# Patient Record
Sex: Female | Born: 1935
Health system: Southern US, Community
[De-identification: ages and names within clinical notes are randomized; demographics above are authoritative.]

## PROBLEM LIST (undated history)

## (undated) DIAGNOSIS — C801 Malignant (primary) neoplasm, unspecified: Secondary | ICD-10-CM

## (undated) DIAGNOSIS — I509 Heart failure, unspecified: Secondary | ICD-10-CM

---

## 1973-05-15 HISTORY — PX: ABDOMINAL HYSTERECTOMY: SHX81

## 1997-08-25 ENCOUNTER — Other Ambulatory Visit: Admission: RE | Admit: 1997-08-25 | Discharge: 1997-08-25 | Payer: Self-pay | Admitting: Family Medicine

## 2000-01-27 ENCOUNTER — Encounter: Admission: RE | Admit: 2000-01-27 | Discharge: 2000-01-27 | Payer: Self-pay | Admitting: Family Medicine

## 2000-01-27 ENCOUNTER — Encounter: Payer: Self-pay | Admitting: Family Medicine

## 2000-10-04 ENCOUNTER — Other Ambulatory Visit: Admission: RE | Admit: 2000-10-04 | Discharge: 2000-10-04 | Payer: Self-pay | Admitting: Family Medicine

## 2001-01-28 ENCOUNTER — Encounter: Admission: RE | Admit: 2001-01-28 | Discharge: 2001-01-28 | Payer: Self-pay | Admitting: Family Medicine

## 2001-01-28 ENCOUNTER — Encounter: Payer: Self-pay | Admitting: Family Medicine

## 2002-01-29 ENCOUNTER — Encounter: Payer: Self-pay | Admitting: Family Medicine

## 2002-01-29 ENCOUNTER — Encounter: Admission: RE | Admit: 2002-01-29 | Discharge: 2002-01-29 | Payer: Self-pay | Admitting: Family Medicine

## 2002-02-04 ENCOUNTER — Other Ambulatory Visit: Admission: RE | Admit: 2002-02-04 | Discharge: 2002-02-04 | Payer: Self-pay | Admitting: Family Medicine

## 2002-02-11 ENCOUNTER — Encounter: Admission: RE | Admit: 2002-02-11 | Discharge: 2002-02-11 | Payer: Self-pay | Admitting: Family Medicine

## 2002-02-11 ENCOUNTER — Encounter: Payer: Self-pay | Admitting: Family Medicine

## 2003-02-04 ENCOUNTER — Encounter: Admission: RE | Admit: 2003-02-04 | Discharge: 2003-02-04 | Payer: Self-pay | Admitting: Family Medicine

## 2003-02-04 ENCOUNTER — Encounter: Payer: Self-pay | Admitting: Family Medicine

## 2003-02-11 ENCOUNTER — Other Ambulatory Visit: Admission: RE | Admit: 2003-02-11 | Discharge: 2003-02-11 | Payer: Self-pay | Admitting: Family Medicine

## 2004-02-16 ENCOUNTER — Encounter: Admission: RE | Admit: 2004-02-16 | Discharge: 2004-02-16 | Payer: Self-pay | Admitting: Family Medicine

## 2005-02-23 ENCOUNTER — Encounter: Admission: RE | Admit: 2005-02-23 | Discharge: 2005-02-23 | Payer: Self-pay | Admitting: Family Medicine

## 2006-03-16 ENCOUNTER — Other Ambulatory Visit: Admission: RE | Admit: 2006-03-16 | Discharge: 2006-03-16 | Payer: Self-pay | Admitting: Family Medicine

## 2006-03-29 ENCOUNTER — Encounter: Admission: RE | Admit: 2006-03-29 | Discharge: 2006-03-29 | Payer: Self-pay | Admitting: Family Medicine

## 2007-04-19 ENCOUNTER — Encounter: Admission: RE | Admit: 2007-04-19 | Discharge: 2007-04-19 | Payer: Self-pay | Admitting: Family Medicine

## 2008-04-20 ENCOUNTER — Encounter: Admission: RE | Admit: 2008-04-20 | Discharge: 2008-04-20 | Payer: Self-pay | Admitting: Family Medicine

## 2008-06-02 ENCOUNTER — Other Ambulatory Visit: Admission: RE | Admit: 2008-06-02 | Discharge: 2008-06-02 | Payer: Self-pay | Admitting: Family Medicine

## 2009-04-26 ENCOUNTER — Encounter: Admission: RE | Admit: 2009-04-26 | Discharge: 2009-04-26 | Payer: Self-pay | Admitting: Family Medicine

## 2010-04-27 ENCOUNTER — Encounter
Admission: RE | Admit: 2010-04-27 | Discharge: 2010-04-27 | Payer: Self-pay | Source: Home / Self Care | Attending: Geriatric Medicine | Admitting: Geriatric Medicine

## 2011-03-20 ENCOUNTER — Other Ambulatory Visit: Payer: Self-pay | Admitting: Geriatric Medicine

## 2011-03-20 DIAGNOSIS — Z1231 Encounter for screening mammogram for malignant neoplasm of breast: Secondary | ICD-10-CM

## 2011-05-01 ENCOUNTER — Other Ambulatory Visit: Payer: Self-pay | Admitting: Family Medicine

## 2011-05-01 ENCOUNTER — Ambulatory Visit
Admission: RE | Admit: 2011-05-01 | Discharge: 2011-05-01 | Disposition: A | Discharge status: Home / Self Care | Payer: Medicare Other | Source: Ambulatory Visit | Attending: Geriatric Medicine | Admitting: Geriatric Medicine

## 2011-05-01 DIAGNOSIS — Z1231 Encounter for screening mammogram for malignant neoplasm of breast: Secondary | ICD-10-CM

## 2011-11-30 ENCOUNTER — Other Ambulatory Visit: Payer: Self-pay | Admitting: Family Medicine

## 2011-11-30 DIAGNOSIS — Z1231 Encounter for screening mammogram for malignant neoplasm of breast: Secondary | ICD-10-CM

## 2011-11-30 DIAGNOSIS — Z780 Asymptomatic menopausal state: Secondary | ICD-10-CM

## 2012-05-01 ENCOUNTER — Ambulatory Visit
Admission: RE | Admit: 2012-05-01 | Discharge: 2012-05-01 | Disposition: A | Discharge status: Home / Self Care | Payer: Medicare Other | Source: Ambulatory Visit | Attending: Family Medicine | Admitting: Family Medicine

## 2012-05-01 DIAGNOSIS — Z1231 Encounter for screening mammogram for malignant neoplasm of breast: Secondary | ICD-10-CM

## 2012-05-01 DIAGNOSIS — Z780 Asymptomatic menopausal state: Secondary | ICD-10-CM

## 2013-04-04 ENCOUNTER — Other Ambulatory Visit: Payer: Self-pay

## 2013-04-04 DIAGNOSIS — Z1231 Encounter for screening mammogram for malignant neoplasm of breast: Secondary | ICD-10-CM

## 2013-05-02 ENCOUNTER — Ambulatory Visit
Admission: RE | Admit: 2013-05-02 | Discharge: 2013-05-02 | Disposition: A | Discharge status: Home / Self Care | Payer: Medicare Other | Source: Ambulatory Visit

## 2013-05-02 DIAGNOSIS — Z1231 Encounter for screening mammogram for malignant neoplasm of breast: Secondary | ICD-10-CM

## 2014-04-07 ENCOUNTER — Other Ambulatory Visit: Payer: Self-pay

## 2014-04-07 DIAGNOSIS — Z1231 Encounter for screening mammogram for malignant neoplasm of breast: Secondary | ICD-10-CM

## 2014-05-04 ENCOUNTER — Ambulatory Visit
Admission: RE | Admit: 2014-05-04 | Discharge: 2014-05-04 | Disposition: A | Discharge status: Home / Self Care | Payer: Medicare HMO | Source: Ambulatory Visit

## 2014-05-04 DIAGNOSIS — Z1231 Encounter for screening mammogram for malignant neoplasm of breast: Secondary | ICD-10-CM

## 2015-02-05 ENCOUNTER — Other Ambulatory Visit: Payer: Self-pay | Admitting: Family Medicine

## 2015-02-05 DIAGNOSIS — M8580 Other specified disorders of bone density and structure, unspecified site: Secondary | ICD-10-CM

## 2015-02-05 DIAGNOSIS — Z1231 Encounter for screening mammogram for malignant neoplasm of breast: Secondary | ICD-10-CM

## 2015-03-24 DIAGNOSIS — Z23 Encounter for immunization: Secondary | ICD-10-CM | POA: Diagnosis not present

## 2015-04-01 DIAGNOSIS — Z008 Encounter for other general examination: Secondary | ICD-10-CM | POA: Diagnosis not present

## 2015-05-07 ENCOUNTER — Ambulatory Visit
Admission: RE | Admit: 2015-05-07 | Discharge: 2015-05-07 | Disposition: A | Discharge status: Home / Self Care | Payer: Medicare HMO | Source: Ambulatory Visit | Attending: Family Medicine | Admitting: Family Medicine

## 2015-05-07 DIAGNOSIS — M8580 Other specified disorders of bone density and structure, unspecified site: Secondary | ICD-10-CM

## 2015-05-07 DIAGNOSIS — M8589 Other specified disorders of bone density and structure, multiple sites: Secondary | ICD-10-CM | POA: Diagnosis not present

## 2015-05-07 DIAGNOSIS — Z1231 Encounter for screening mammogram for malignant neoplasm of breast: Secondary | ICD-10-CM | POA: Diagnosis not present

## 2015-08-12 DIAGNOSIS — M25551 Pain in right hip: Secondary | ICD-10-CM | POA: Diagnosis not present

## 2015-08-12 DIAGNOSIS — Z92241 Personal history of systemic steroid therapy: Secondary | ICD-10-CM | POA: Diagnosis not present

## 2015-08-12 DIAGNOSIS — Z6827 Body mass index (BMI) 27.0-27.9, adult: Secondary | ICD-10-CM | POA: Diagnosis not present

## 2015-08-12 DIAGNOSIS — M8580 Other specified disorders of bone density and structure, unspecified site: Secondary | ICD-10-CM | POA: Diagnosis not present

## 2015-08-18 ENCOUNTER — Other Ambulatory Visit: Payer: Self-pay | Admitting: Family Medicine

## 2015-08-18 ENCOUNTER — Ambulatory Visit
Admission: RE | Admit: 2015-08-18 | Discharge: 2015-08-18 | Disposition: A | Discharge status: Home / Self Care | Payer: Medicare HMO | Source: Ambulatory Visit | Attending: Family Medicine | Admitting: Family Medicine

## 2015-08-18 DIAGNOSIS — M25551 Pain in right hip: Secondary | ICD-10-CM

## 2015-08-18 DIAGNOSIS — M1611 Unilateral primary osteoarthritis, right hip: Secondary | ICD-10-CM | POA: Diagnosis not present

## 2015-08-30 DIAGNOSIS — M1611 Unilateral primary osteoarthritis, right hip: Secondary | ICD-10-CM | POA: Diagnosis not present

## 2015-09-01 DIAGNOSIS — R262 Difficulty in walking, not elsewhere classified: Secondary | ICD-10-CM | POA: Diagnosis not present

## 2015-09-01 DIAGNOSIS — M545 Low back pain: Secondary | ICD-10-CM | POA: Diagnosis not present

## 2015-09-01 DIAGNOSIS — M1611 Unilateral primary osteoarthritis, right hip: Secondary | ICD-10-CM | POA: Diagnosis not present

## 2016-01-06 DIAGNOSIS — Z6829 Body mass index (BMI) 29.0-29.9, adult: Secondary | ICD-10-CM | POA: Diagnosis not present

## 2016-01-06 DIAGNOSIS — Z0000 Encounter for general adult medical examination without abnormal findings: Secondary | ICD-10-CM | POA: Diagnosis not present

## 2016-02-09 DIAGNOSIS — Z1389 Encounter for screening for other disorder: Secondary | ICD-10-CM | POA: Diagnosis not present

## 2016-02-09 DIAGNOSIS — Z131 Encounter for screening for diabetes mellitus: Secondary | ICD-10-CM | POA: Diagnosis not present

## 2016-02-09 DIAGNOSIS — Z9181 History of falling: Secondary | ICD-10-CM | POA: Diagnosis not present

## 2016-02-09 DIAGNOSIS — Z23 Encounter for immunization: Secondary | ICD-10-CM | POA: Diagnosis not present

## 2016-02-09 DIAGNOSIS — Z0000 Encounter for general adult medical examination without abnormal findings: Secondary | ICD-10-CM | POA: Diagnosis not present

## 2016-02-09 DIAGNOSIS — Z6828 Body mass index (BMI) 28.0-28.9, adult: Secondary | ICD-10-CM | POA: Diagnosis not present

## 2016-02-09 DIAGNOSIS — Z79899 Other long term (current) drug therapy: Secondary | ICD-10-CM | POA: Diagnosis not present

## 2016-02-09 DIAGNOSIS — M8580 Other specified disorders of bone density and structure, unspecified site: Secondary | ICD-10-CM | POA: Diagnosis not present

## 2016-04-10 ENCOUNTER — Other Ambulatory Visit: Payer: Self-pay | Admitting: Physician Assistant

## 2016-04-10 DIAGNOSIS — Z1231 Encounter for screening mammogram for malignant neoplasm of breast: Secondary | ICD-10-CM

## 2016-05-09 ENCOUNTER — Ambulatory Visit: Payer: Medicare HMO

## 2016-05-11 ENCOUNTER — Ambulatory Visit
Admission: RE | Admit: 2016-05-11 | Discharge: 2016-05-11 | Disposition: A | Discharge status: Home / Self Care | Payer: Medicare HMO | Source: Ambulatory Visit | Attending: Physician Assistant | Admitting: Physician Assistant

## 2016-05-11 DIAGNOSIS — H16143 Punctate keratitis, bilateral: Secondary | ICD-10-CM | POA: Diagnosis not present

## 2016-05-11 DIAGNOSIS — Z961 Presence of intraocular lens: Secondary | ICD-10-CM | POA: Diagnosis not present

## 2016-05-11 DIAGNOSIS — H04123 Dry eye syndrome of bilateral lacrimal glands: Secondary | ICD-10-CM | POA: Diagnosis not present

## 2016-05-11 DIAGNOSIS — H35372 Puckering of macula, left eye: Secondary | ICD-10-CM | POA: Diagnosis not present

## 2016-05-11 DIAGNOSIS — H40023 Open angle with borderline findings, high risk, bilateral: Secondary | ICD-10-CM | POA: Diagnosis not present

## 2016-05-11 DIAGNOSIS — Z1231 Encounter for screening mammogram for malignant neoplasm of breast: Secondary | ICD-10-CM

## 2016-05-11 DIAGNOSIS — H11423 Conjunctival edema, bilateral: Secondary | ICD-10-CM | POA: Diagnosis not present

## 2016-05-11 DIAGNOSIS — H21262 Iris atrophy (essential) (progressive), left eye: Secondary | ICD-10-CM | POA: Diagnosis not present

## 2016-05-11 DIAGNOSIS — H353131 Nonexudative age-related macular degeneration, bilateral, early dry stage: Secondary | ICD-10-CM | POA: Diagnosis not present

## 2016-05-11 DIAGNOSIS — H40003 Preglaucoma, unspecified, bilateral: Secondary | ICD-10-CM | POA: Diagnosis not present

## 2016-05-11 DIAGNOSIS — Z9849 Cataract extraction status, unspecified eye: Secondary | ICD-10-CM | POA: Diagnosis not present

## 2016-07-04 DIAGNOSIS — Z6827 Body mass index (BMI) 27.0-27.9, adult: Secondary | ICD-10-CM | POA: Diagnosis not present

## 2016-07-04 DIAGNOSIS — M79644 Pain in right finger(s): Secondary | ICD-10-CM | POA: Diagnosis not present

## 2016-08-03 DIAGNOSIS — Z0000 Encounter for general adult medical examination without abnormal findings: Secondary | ICD-10-CM | POA: Diagnosis not present

## 2016-08-03 DIAGNOSIS — Z6828 Body mass index (BMI) 28.0-28.9, adult: Secondary | ICD-10-CM | POA: Diagnosis not present

## 2016-08-03 DIAGNOSIS — H9113 Presbycusis, bilateral: Secondary | ICD-10-CM | POA: Diagnosis not present

## 2016-08-03 DIAGNOSIS — M130 Polyarthritis, unspecified: Secondary | ICD-10-CM | POA: Diagnosis not present

## 2016-08-03 DIAGNOSIS — Z9849 Cataract extraction status, unspecified eye: Secondary | ICD-10-CM | POA: Diagnosis not present

## 2016-08-03 DIAGNOSIS — Z9181 History of falling: Secondary | ICD-10-CM | POA: Diagnosis not present

## 2016-08-03 DIAGNOSIS — Z974 Presence of external hearing-aid: Secondary | ICD-10-CM | POA: Diagnosis not present

## 2016-08-03 DIAGNOSIS — Z87891 Personal history of nicotine dependence: Secondary | ICD-10-CM | POA: Diagnosis not present

## 2016-08-07 DIAGNOSIS — Z6828 Body mass index (BMI) 28.0-28.9, adult: Secondary | ICD-10-CM | POA: Diagnosis not present

## 2016-08-07 DIAGNOSIS — M79644 Pain in right finger(s): Secondary | ICD-10-CM | POA: Diagnosis not present

## 2016-08-25 DIAGNOSIS — M65311 Trigger thumb, right thumb: Secondary | ICD-10-CM | POA: Diagnosis not present

## 2016-08-25 DIAGNOSIS — R52 Pain, unspecified: Secondary | ICD-10-CM | POA: Diagnosis not present

## 2016-09-12 DIAGNOSIS — Z6828 Body mass index (BMI) 28.0-28.9, adult: Secondary | ICD-10-CM | POA: Diagnosis not present

## 2016-09-12 DIAGNOSIS — M25562 Pain in left knee: Secondary | ICD-10-CM | POA: Diagnosis not present

## 2016-09-22 DIAGNOSIS — M1811 Unilateral primary osteoarthritis of first carpometacarpal joint, right hand: Secondary | ICD-10-CM | POA: Diagnosis not present

## 2016-09-22 DIAGNOSIS — M65311 Trigger thumb, right thumb: Secondary | ICD-10-CM | POA: Diagnosis not present

## 2017-02-22 DIAGNOSIS — Z23 Encounter for immunization: Secondary | ICD-10-CM | POA: Diagnosis not present

## 2017-04-02 DIAGNOSIS — Z1231 Encounter for screening mammogram for malignant neoplasm of breast: Secondary | ICD-10-CM | POA: Diagnosis not present

## 2017-04-02 DIAGNOSIS — Z9181 History of falling: Secondary | ICD-10-CM | POA: Diagnosis not present

## 2017-04-02 DIAGNOSIS — M8580 Other specified disorders of bone density and structure, unspecified site: Secondary | ICD-10-CM | POA: Diagnosis not present

## 2017-04-02 DIAGNOSIS — Z0000 Encounter for general adult medical examination without abnormal findings: Secondary | ICD-10-CM | POA: Diagnosis not present

## 2017-04-04 ENCOUNTER — Other Ambulatory Visit: Payer: Self-pay | Admitting: Nurse Practitioner

## 2017-04-04 DIAGNOSIS — Z1231 Encounter for screening mammogram for malignant neoplasm of breast: Secondary | ICD-10-CM

## 2017-04-04 DIAGNOSIS — M8580 Other specified disorders of bone density and structure, unspecified site: Secondary | ICD-10-CM

## 2017-05-14 ENCOUNTER — Ambulatory Visit
Admission: RE | Admit: 2017-05-14 | Discharge: 2017-05-14 | Disposition: A | Discharge status: Home / Self Care | Payer: Medicare HMO | Source: Ambulatory Visit | Attending: Nurse Practitioner | Admitting: Nurse Practitioner

## 2017-05-14 DIAGNOSIS — Z780 Asymptomatic menopausal state: Secondary | ICD-10-CM | POA: Diagnosis not present

## 2017-05-14 DIAGNOSIS — Z1231 Encounter for screening mammogram for malignant neoplasm of breast: Secondary | ICD-10-CM

## 2017-05-14 DIAGNOSIS — M8580 Other specified disorders of bone density and structure, unspecified site: Secondary | ICD-10-CM

## 2017-05-14 DIAGNOSIS — M8589 Other specified disorders of bone density and structure, multiple sites: Secondary | ICD-10-CM | POA: Diagnosis not present

## 2017-05-21 DIAGNOSIS — M8580 Other specified disorders of bone density and structure, unspecified site: Secondary | ICD-10-CM | POA: Diagnosis not present

## 2017-05-21 DIAGNOSIS — Z6827 Body mass index (BMI) 27.0-27.9, adult: Secondary | ICD-10-CM | POA: Diagnosis not present

## 2017-05-21 DIAGNOSIS — J069 Acute upper respiratory infection, unspecified: Secondary | ICD-10-CM | POA: Diagnosis not present

## 2017-06-13 DIAGNOSIS — H11423 Conjunctival edema, bilateral: Secondary | ICD-10-CM | POA: Diagnosis not present

## 2017-06-13 DIAGNOSIS — Z9841 Cataract extraction status, right eye: Secondary | ICD-10-CM | POA: Diagnosis not present

## 2017-06-13 DIAGNOSIS — H40023 Open angle with borderline findings, high risk, bilateral: Secondary | ICD-10-CM | POA: Diagnosis not present

## 2017-06-13 DIAGNOSIS — H18419 Arcus senilis, unspecified eye: Secondary | ICD-10-CM | POA: Diagnosis not present

## 2017-06-13 DIAGNOSIS — H35372 Puckering of macula, left eye: Secondary | ICD-10-CM | POA: Diagnosis not present

## 2017-06-13 DIAGNOSIS — H353131 Nonexudative age-related macular degeneration, bilateral, early dry stage: Secondary | ICD-10-CM | POA: Diagnosis not present

## 2017-06-13 DIAGNOSIS — H11153 Pinguecula, bilateral: Secondary | ICD-10-CM | POA: Diagnosis not present

## 2017-06-13 DIAGNOSIS — H21262 Iris atrophy (essential) (progressive), left eye: Secondary | ICD-10-CM | POA: Diagnosis not present

## 2017-06-13 DIAGNOSIS — H35033 Hypertensive retinopathy, bilateral: Secondary | ICD-10-CM | POA: Diagnosis not present

## 2017-06-13 DIAGNOSIS — H3589 Other specified retinal disorders: Secondary | ICD-10-CM | POA: Diagnosis not present

## 2017-11-05 DIAGNOSIS — Z87891 Personal history of nicotine dependence: Secondary | ICD-10-CM | POA: Diagnosis not present

## 2017-11-05 DIAGNOSIS — Z833 Family history of diabetes mellitus: Secondary | ICD-10-CM | POA: Diagnosis not present

## 2017-11-05 DIAGNOSIS — Z809 Family history of malignant neoplasm, unspecified: Secondary | ICD-10-CM | POA: Diagnosis not present

## 2017-11-05 DIAGNOSIS — R32 Unspecified urinary incontinence: Secondary | ICD-10-CM | POA: Diagnosis not present

## 2017-11-05 DIAGNOSIS — Z85828 Personal history of other malignant neoplasm of skin: Secondary | ICD-10-CM | POA: Diagnosis not present

## 2017-11-05 DIAGNOSIS — H04129 Dry eye syndrome of unspecified lacrimal gland: Secondary | ICD-10-CM | POA: Diagnosis not present

## 2017-12-12 DIAGNOSIS — H35033 Hypertensive retinopathy, bilateral: Secondary | ICD-10-CM | POA: Diagnosis not present

## 2017-12-12 DIAGNOSIS — H43813 Vitreous degeneration, bilateral: Secondary | ICD-10-CM | POA: Diagnosis not present

## 2017-12-12 DIAGNOSIS — H0100A Unspecified blepharitis right eye, upper and lower eyelids: Secondary | ICD-10-CM | POA: Diagnosis not present

## 2017-12-12 DIAGNOSIS — H04123 Dry eye syndrome of bilateral lacrimal glands: Secondary | ICD-10-CM | POA: Diagnosis not present

## 2017-12-12 DIAGNOSIS — H18893 Other specified disorders of cornea, bilateral: Secondary | ICD-10-CM | POA: Diagnosis not present

## 2017-12-12 DIAGNOSIS — H21262 Iris atrophy (essential) (progressive), left eye: Secondary | ICD-10-CM | POA: Diagnosis not present

## 2017-12-12 DIAGNOSIS — H3589 Other specified retinal disorders: Secondary | ICD-10-CM | POA: Diagnosis not present

## 2017-12-12 DIAGNOSIS — H3530 Unspecified macular degeneration: Secondary | ICD-10-CM | POA: Diagnosis not present

## 2017-12-12 DIAGNOSIS — H40023 Open angle with borderline findings, high risk, bilateral: Secondary | ICD-10-CM | POA: Diagnosis not present

## 2017-12-12 DIAGNOSIS — H353131 Nonexudative age-related macular degeneration, bilateral, early dry stage: Secondary | ICD-10-CM | POA: Diagnosis not present

## 2018-02-13 DIAGNOSIS — Z23 Encounter for immunization: Secondary | ICD-10-CM | POA: Diagnosis not present

## 2018-02-13 DIAGNOSIS — Z6827 Body mass index (BMI) 27.0-27.9, adult: Secondary | ICD-10-CM | POA: Diagnosis not present

## 2018-02-13 DIAGNOSIS — K429 Umbilical hernia without obstruction or gangrene: Secondary | ICD-10-CM | POA: Diagnosis not present

## 2018-04-15 ENCOUNTER — Other Ambulatory Visit: Payer: Self-pay | Admitting: Nurse Practitioner

## 2018-04-15 DIAGNOSIS — Z1231 Encounter for screening mammogram for malignant neoplasm of breast: Secondary | ICD-10-CM

## 2018-04-22 DIAGNOSIS — Z6826 Body mass index (BMI) 26.0-26.9, adult: Secondary | ICD-10-CM | POA: Diagnosis not present

## 2018-04-22 DIAGNOSIS — K5900 Constipation, unspecified: Secondary | ICD-10-CM | POA: Diagnosis not present

## 2018-04-22 DIAGNOSIS — Z9181 History of falling: Secondary | ICD-10-CM | POA: Diagnosis not present

## 2018-04-22 DIAGNOSIS — K429 Umbilical hernia without obstruction or gangrene: Secondary | ICD-10-CM | POA: Diagnosis not present

## 2018-04-22 DIAGNOSIS — Z1331 Encounter for screening for depression: Secondary | ICD-10-CM | POA: Diagnosis not present

## 2018-05-23 ENCOUNTER — Ambulatory Visit
Admission: RE | Admit: 2018-05-23 | Discharge: 2018-05-23 | Disposition: A | Discharge status: Home / Self Care | Payer: Medicare HMO | Source: Ambulatory Visit | Attending: Nurse Practitioner | Admitting: Nurse Practitioner

## 2018-05-23 DIAGNOSIS — Z1231 Encounter for screening mammogram for malignant neoplasm of breast: Secondary | ICD-10-CM

## 2018-05-29 DIAGNOSIS — K429 Umbilical hernia without obstruction or gangrene: Secondary | ICD-10-CM | POA: Diagnosis not present

## 2018-05-31 DIAGNOSIS — R1011 Right upper quadrant pain: Secondary | ICD-10-CM | POA: Diagnosis not present

## 2018-06-05 ENCOUNTER — Ambulatory Visit
Admission: RE | Admit: 2018-06-05 | Discharge: 2018-06-05 | Disposition: A | Discharge status: Home / Self Care | Payer: Medicare HMO | Source: Ambulatory Visit | Attending: Nurse Practitioner | Admitting: Nurse Practitioner

## 2018-06-05 ENCOUNTER — Other Ambulatory Visit: Payer: Self-pay | Admitting: Nurse Practitioner

## 2018-06-05 DIAGNOSIS — J90 Pleural effusion, not elsewhere classified: Secondary | ICD-10-CM

## 2018-06-14 DIAGNOSIS — K429 Umbilical hernia without obstruction or gangrene: Secondary | ICD-10-CM | POA: Diagnosis not present

## 2018-06-14 DIAGNOSIS — Z6825 Body mass index (BMI) 25.0-25.9, adult: Secondary | ICD-10-CM | POA: Diagnosis not present

## 2018-06-14 DIAGNOSIS — R0602 Shortness of breath: Secondary | ICD-10-CM | POA: Diagnosis not present

## 2018-06-14 DIAGNOSIS — J90 Pleural effusion, not elsewhere classified: Secondary | ICD-10-CM | POA: Diagnosis not present

## 2018-06-15 ENCOUNTER — Emergency Department (HOSPITAL_COMMUNITY): Payer: Medicare HMO

## 2018-06-15 ENCOUNTER — Inpatient Hospital Stay (HOSPITAL_COMMUNITY)
Admission: EM | Admit: 2018-06-15 | Discharge: 2018-06-20 | DRG: 291 | Disposition: A | Discharge status: Home Health | Payer: Medicare HMO | Attending: Internal Medicine | Admitting: Internal Medicine

## 2018-06-15 ENCOUNTER — Other Ambulatory Visit: Payer: Self-pay

## 2018-06-15 ENCOUNTER — Encounter (HOSPITAL_COMMUNITY): Payer: Self-pay | Admitting: Emergency Medicine

## 2018-06-15 DIAGNOSIS — R1310 Dysphagia, unspecified: Secondary | ICD-10-CM | POA: Diagnosis present

## 2018-06-15 DIAGNOSIS — R05 Cough: Secondary | ICD-10-CM | POA: Diagnosis not present

## 2018-06-15 DIAGNOSIS — I110 Hypertensive heart disease with heart failure: Secondary | ICD-10-CM | POA: Diagnosis not present

## 2018-06-15 DIAGNOSIS — J90 Pleural effusion, not elsewhere classified: Secondary | ICD-10-CM | POA: Diagnosis not present

## 2018-06-15 DIAGNOSIS — I509 Heart failure, unspecified: Secondary | ICD-10-CM | POA: Diagnosis not present

## 2018-06-15 DIAGNOSIS — Y95 Nosocomial condition: Secondary | ICD-10-CM | POA: Diagnosis present

## 2018-06-15 DIAGNOSIS — Z90710 Acquired absence of both cervix and uterus: Secondary | ICD-10-CM | POA: Diagnosis not present

## 2018-06-15 DIAGNOSIS — R739 Hyperglycemia, unspecified: Secondary | ICD-10-CM | POA: Diagnosis present

## 2018-06-15 DIAGNOSIS — I5033 Acute on chronic diastolic (congestive) heart failure: Secondary | ICD-10-CM | POA: Diagnosis not present

## 2018-06-15 DIAGNOSIS — D509 Iron deficiency anemia, unspecified: Secondary | ICD-10-CM | POA: Diagnosis present

## 2018-06-15 DIAGNOSIS — Z79899 Other long term (current) drug therapy: Secondary | ICD-10-CM

## 2018-06-15 DIAGNOSIS — J181 Lobar pneumonia, unspecified organism: Secondary | ICD-10-CM | POA: Diagnosis not present

## 2018-06-15 DIAGNOSIS — J189 Pneumonia, unspecified organism: Secondary | ICD-10-CM | POA: Diagnosis not present

## 2018-06-15 DIAGNOSIS — R0602 Shortness of breath: Secondary | ICD-10-CM | POA: Diagnosis not present

## 2018-06-15 DIAGNOSIS — M810 Age-related osteoporosis without current pathological fracture: Secondary | ICD-10-CM | POA: Diagnosis not present

## 2018-06-15 DIAGNOSIS — T380X5A Adverse effect of glucocorticoids and synthetic analogues, initial encounter: Secondary | ICD-10-CM | POA: Diagnosis not present

## 2018-06-15 DIAGNOSIS — J918 Pleural effusion in other conditions classified elsewhere: Secondary | ICD-10-CM | POA: Diagnosis not present

## 2018-06-15 DIAGNOSIS — J9601 Acute respiratory failure with hypoxia: Secondary | ICD-10-CM | POA: Diagnosis not present

## 2018-06-15 DIAGNOSIS — J910 Malignant pleural effusion: Secondary | ICD-10-CM | POA: Diagnosis not present

## 2018-06-15 DIAGNOSIS — C384 Malignant neoplasm of pleura: Secondary | ICD-10-CM | POA: Diagnosis not present

## 2018-06-15 DIAGNOSIS — R0600 Dyspnea, unspecified: Secondary | ICD-10-CM | POA: Diagnosis not present

## 2018-06-15 LAB — CBC WITH DIFFERENTIAL/PLATELET
Abs Immature Granulocytes: 0.02 10*3/uL (ref 0.00–0.07)
Basophils Absolute: 0 10*3/uL (ref 0.0–0.1)
Basophils Relative: 1 %
Eosinophils Absolute: 0.1 10*3/uL (ref 0.0–0.5)
Eosinophils Relative: 2 %
HCT: 39.2 % (ref 36.0–46.0)
Hemoglobin: 11.9 g/dL — ABNORMAL LOW (ref 12.0–15.0)
IMMATURE GRANULOCYTES: 0 %
Lymphocytes Relative: 22 %
Lymphs Abs: 1.2 10*3/uL (ref 0.7–4.0)
MCH: 22.8 pg — ABNORMAL LOW (ref 26.0–34.0)
MCHC: 30.4 g/dL (ref 30.0–36.0)
MCV: 75.1 fL — AB (ref 80.0–100.0)
Monocytes Absolute: 0.6 10*3/uL (ref 0.1–1.0)
Monocytes Relative: 11 %
Neutro Abs: 3.4 10*3/uL (ref 1.7–7.7)
Neutrophils Relative %: 64 %
Platelets: 188 10*3/uL (ref 150–400)
RBC: 5.22 MIL/uL — ABNORMAL HIGH (ref 3.87–5.11)
RDW: 13.7 % (ref 11.5–15.5)
WBC: 5.2 10*3/uL (ref 4.0–10.5)
nRBC: 0 % (ref 0.0–0.2)

## 2018-06-15 LAB — RESPIRATORY PANEL BY PCR
Adenovirus: NOT DETECTED
Bordetella pertussis: NOT DETECTED
CHLAMYDOPHILA PNEUMONIAE-RVPPCR: NOT DETECTED
CORONAVIRUS HKU1-RVPPCR: NOT DETECTED
Coronavirus 229E: NOT DETECTED
Coronavirus NL63: NOT DETECTED
Coronavirus OC43: NOT DETECTED
Influenza A: NOT DETECTED
Influenza B: NOT DETECTED
Metapneumovirus: NOT DETECTED
Mycoplasma pneumoniae: NOT DETECTED
Parainfluenza Virus 1: NOT DETECTED
Parainfluenza Virus 2: NOT DETECTED
Parainfluenza Virus 3: NOT DETECTED
Parainfluenza Virus 4: NOT DETECTED
Respiratory Syncytial Virus: NOT DETECTED
Rhinovirus / Enterovirus: NOT DETECTED

## 2018-06-15 LAB — COMPREHENSIVE METABOLIC PANEL
ALT: 16 U/L (ref 0–44)
AST: 21 U/L (ref 15–41)
Albumin: 3.4 g/dL — ABNORMAL LOW (ref 3.5–5.0)
Alkaline Phosphatase: 101 U/L (ref 38–126)
Anion gap: 9 (ref 5–15)
BUN: 13 mg/dL (ref 8–23)
CO2: 24 mmol/L (ref 22–32)
Calcium: 8.7 mg/dL — ABNORMAL LOW (ref 8.9–10.3)
Chloride: 105 mmol/L (ref 98–111)
Creatinine, Ser: 1 mg/dL (ref 0.44–1.00)
GFR calc Af Amer: 59 mL/min — ABNORMAL LOW (ref >60)
GFR, EST NON AFRICAN AMERICAN: 51 mL/min — AB (ref >60)
Glucose, Bld: 113 mg/dL — ABNORMAL HIGH (ref 70–99)
Potassium: 4.5 mmol/L (ref 3.5–5.1)
Sodium: 138 mmol/L (ref 135–145)
Total Bilirubin: 0.7 mg/dL (ref 0.3–1.2)
Total Protein: 6.5 g/dL (ref 6.5–8.1)

## 2018-06-15 LAB — I-STAT TROPONIN, ED: Troponin i, poc: 0 ng/mL (ref 0.00–0.08)

## 2018-06-15 LAB — BRAIN NATRIURETIC PEPTIDE: B NATRIURETIC PEPTIDE 5: 18 pg/mL (ref 0.0–100.0)

## 2018-06-15 LAB — LACTIC ACID, PLASMA
Lactic Acid, Venous: 1.1 mmol/L (ref 0.5–1.9)
Lactic Acid, Venous: 1.1 mmol/L (ref 0.5–1.9)

## 2018-06-15 LAB — PROCALCITONIN

## 2018-06-15 LAB — STREP PNEUMONIAE URINARY ANTIGEN: Strep Pneumo Urinary Antigen: NEGATIVE

## 2018-06-15 MED ORDER — SODIUM CHLORIDE 0.9 % IV SOLN
500.0000 mg | INTRAVENOUS | Status: DC
  Administered 2018-06-16: 500 mg via INTRAVENOUS
  Filled 2018-06-15 (×2): qty 500

## 2018-06-15 MED ORDER — SODIUM CHLORIDE 0.9 % IV SOLN
INTRAVENOUS | Status: DC
  Administered 2018-06-15 – 2018-06-16 (×3): via INTRAVENOUS

## 2018-06-15 MED ORDER — SODIUM CHLORIDE 0.9 % IV SOLN
500.0000 mg | Freq: Once | INTRAVENOUS | Status: AC
  Administered 2018-06-15: 500 mg via INTRAVENOUS
  Filled 2018-06-15: qty 500

## 2018-06-15 MED ORDER — ENOXAPARIN SODIUM 40 MG/0.4ML ~~LOC~~ SOLN
40.0000 mg | SUBCUTANEOUS | Status: DC
  Administered 2018-06-15 – 2018-06-19 (×5): 40 mg via SUBCUTANEOUS
  Filled 2018-06-15 (×5): qty 0.4

## 2018-06-15 MED ORDER — PREDNISONE 20 MG PO TABS
20.0000 mg | ORAL_TABLET | Freq: Every day | ORAL | Status: DC
  Administered 2018-06-15 – 2018-06-17 (×3): 20 mg via ORAL
  Filled 2018-06-15 (×3): qty 1

## 2018-06-15 MED ORDER — SODIUM CHLORIDE 0.9 % IV SOLN
1.0000 g | Freq: Once | INTRAVENOUS | Status: AC
  Administered 2018-06-15: 1 g via INTRAVENOUS
  Filled 2018-06-15: qty 10

## 2018-06-15 MED ORDER — SODIUM CHLORIDE 0.9 % IV SOLN
1.0000 g | INTRAVENOUS | Status: DC
  Administered 2018-06-16: 1 g via INTRAVENOUS
  Filled 2018-06-15: qty 10

## 2018-06-15 NOTE — ED Triage Notes (Signed)
Pt here with c/o shob x2 days with cough.  Pt also reports some nausea without vomiting and ribcage pain.

## 2018-06-15 NOTE — ED Notes (Signed)
Patient transported to X-ray 

## 2018-06-15 NOTE — ED Provider Notes (Signed)
[facility] EMERGENCY DEPARTMENT Provider Note   CSN: [id number] Arrival date & time: 06/15/18  1230     History   Chief Complaint Chief Complaint  Patient presents with  . Shortness of Breath  . Cough  . Nausea    HPI Ann Cline is a 85 y.o. female.  HPI   85 year old female with no significant medical history presents with concern for cough, shortness of breath and chest tightness.  Reports that she has had cough over the last 3 weeks, was initially treated with antibiotics for pneumonia by her PCP, was scheduled to have follow-up chest x-ray.  Reports over the last 3 days, her symptoms have worsened.  Reports severe shortness of breath, particularly when she stands up or tries to ambulate.  Reports that she has chest tightness that wraps around her bilateral ribs which is also worse with standing and exertion, as well as worsened with deep breaths.  Does report some nausea.  Denies any leg pain or swelling.  Denies any history of DVT, PE, recent surgeries or immobilization.  Denies known fevers.  Reports the shortness of breath improves with rest, and laying down.  Denies orthopnea.  Denies any history of lung disease, smoking, or use of inhalers at any time.  Reports she is otherwise been healthy, and is not seen a physician regularly.   [place] NP PCP History reviewed. No pertinent past medical history.  Patient Active Problem List   Diagnosis Date Noted  . Pneumonia 06/15/2018  . Hyperglycemia 06/15/2018    Past Surgical History:  Procedure Laterality Date  . ABDOMINAL HYSTERECTOMY  1975     OB History   No obstetric history on file.      Home Medications    Prior to Admission medications   Medication Sig Start Date End Date Taking? Authorizing Provider  acetaminophen (TYLENOL) 500 MG tablet Take 1,000 mg by mouth every 6 (six) hours as needed for mild pain or headache.   Yes [provider]    alendronate (FOSAMAX) 70 MG tablet Take 70 mg by mouth once a week. Tuesday 05/15/18  Yes [provider]  carboxymethylcellulose (REFRESH PLUS) 0.5 % SOLN Place 1 drop into both eyes daily as needed (dry eyes).   Yes [provider]  Multiple Vitamin (MULTIVITAMIN) capsule Take 1 capsule by mouth daily.   Yes [provider]  Omega-3 1000 MG CAPS Take 1 capsule by mouth daily.   Yes [provider]    Family History History reviewed. No pertinent family history.  Social History Social History   Tobacco Use  . Smoking status: Never Smoker  . Smokeless tobacco: Never Used  Substance Use Topics  . Alcohol use: Never    Frequency: Never  . Drug use: Never     Allergies   Patient has no known allergies.   Review of Systems Review of Systems  Constitutional: Negative for fever.  HENT: Negative for sore throat.   Eyes: Negative for visual disturbance.  Respiratory: Positive for cough and shortness of breath.   Cardiovascular: Positive for chest pain.  Gastrointestinal: Negative for abdominal pain.  Genitourinary: Negative for difficulty urinating.  Musculoskeletal: Negative for back pain and neck pain.  Skin: Negative for rash.  Neurological: Negative for syncope and headaches.     Physical Exam Updated Vital Signs BP 127/76 (BP Location: Left Arm)   Pulse 78   Temp 99 F (37.2 C) (Oral)   Resp 16  SpO2 98%   Physical Exam Vitals signs and nursing note reviewed.  Constitutional:      General: She is not in acute distress.    Appearance: She is well-developed. She is not diaphoretic.  HENT:     Head: Normocephalic and atraumatic.  Eyes:     Conjunctiva/sclera: Conjunctivae normal.  Neck:     Musculoskeletal: Normal range of motion.  Cardiovascular:     Rate and Rhythm: Normal rate and regular rhythm.     Heart sounds: Normal heart sounds. No murmur. No friction rub. No gallop.   Pulmonary:     Effort: Pulmonary effort is  normal. No respiratory distress.     Breath sounds: Decreased breath sounds and wheezing (occasional) present. No rales.  Abdominal:     General: There is no distension.     Palpations: Abdomen is soft.     Tenderness: There is no abdominal tenderness. There is no guarding.  Musculoskeletal:        General: No tenderness.  Skin:    General: Skin is warm and dry.     Findings: No erythema or rash.  Neurological:     Mental Status: She is alert and oriented to person, place, and time.      ED Treatments / Results  Labs (all labs ordered are listed, but only abnormal results are displayed) Labs Reviewed  CBC WITH DIFFERENTIAL/PLATELET - Abnormal; Notable for the following components:      Result Value   RBC 5.22 (*)    Hemoglobin 11.9 (*)    MCV 75.1 (*)    MCH 22.8 (*)    All other components within normal limits  COMPREHENSIVE METABOLIC PANEL - Abnormal; Notable for the following components:   Glucose, Bld 113 (*)    Calcium 8.7 (*)    Albumin 3.4 (*)    GFR calc non Af Amer 51 (*)    GFR calc Af Amer 59 (*)    All other components within normal limits  CULTURE, BLOOD (ROUTINE X 2)  CULTURE, BLOOD (ROUTINE X 2)  EXPECTORATED SPUTUM ASSESSMENT W REFEX TO RESP CULTURE  RESPIRATORY PANEL BY PCR  BRAIN NATRIURETIC PEPTIDE  LACTIC ACID, PLASMA  LACTIC ACID, PLASMA  PROCALCITONIN  STREP PNEUMONIAE URINARY ANTIGEN  BASIC METABOLIC PANEL  CBC WITH DIFFERENTIAL/PLATELET  I-STAT TROPONIN, ED    EKG EKG Interpretation  Date/Time:  Saturday June 15 2018 12:39:34 EST Ventricular Rate:  81 PR Interval:    QRS Duration: 89 QT Interval:  368 QTC Calculation: 428 R Axis:   -24 Text Interpretation:  Sinus rhythm Borderline left axis deviation Anterior infarct, old No previous ECGs available Confirmed by Gareth Morgan [phone]) on 06/15/2018 2:39:21 PM   Radiology Dg Chest 2 View  Result Date: 06/15/2018 CLINICAL DATA:  Short of breath for 3 days. Productive cough for a  week. EXAM: CHEST - 2 VIEW COMPARISON:  06/05/2018 FINDINGS: Moderate bilateral pleural effusions, increased in size from prior study. There is lung base opacity med is likely atelectasis. Can not exclude pneumonia. Upper lungs are clear.  No evidence of pulmonary edema. Cardiac silhouette is partly obscured. It is grossly normal in size. No pneumothorax. IMPRESSION: 1. Increased size of the bilateral pleural effusions since the prior study. There is associated lung base opacity consistent with atelectasis, pneumonia or a combination. 2. No evidence of pulmonary edema. Electronically Signed   By: Lajean Manes M.D.   On: 06/15/2018 13:36    Procedures Procedures (including critical care time)  Medications Ordered in ED Medications  enoxaparin (LOVENOX) injection 40 mg (40 mg Subcutaneous Given 06/15/18 1754)  0.9 %  sodium chloride infusion ( Intravenous New Bag/Given 06/15/18 1755)  cefTRIAXone (ROCEPHIN) 1 g in sodium chloride 0.9 % 100 mL IVPB (has no administration in time range)  azithromycin (ZITHROMAX) 500 mg in sodium chloride 0.9 % 250 mL IVPB (has no administration in time range)  predniSONE (DELTASONE) tablet 20 mg (20 mg Oral Given 06/15/18 1754)  cefTRIAXone (ROCEPHIN) 1 g in sodium chloride 0.9 % 100 mL IVPB (0 g Intravenous Stopped 06/15/18 1640)  azithromycin (ZITHROMAX) 500 mg in sodium chloride 0.9 % 250 mL IVPB (500 mg Intravenous Transfusing/Transfer 06/15/18 1657)     Initial Impression / Assessment and Plan / ED Course  I have reviewed the triage vital signs and the nursing notes.  Pertinent labs & imaging results that were available during my care of the patient were reviewed by me and considered in my medical decision making (see chart for details).     85yo female with no significant medical history presents with concern for dyspnea, cough, and chest tightness.  Completed abx for pneumonia on Tuesday and has worsening symptoms.  Differential diagnosis for dyspnea includes  ACS, PE, COPD exacerbation, CHF exacerbation, anemia, pneumonia.   EKG was evaluated by me which showed nosignificant findings. Troponin negative. No hx of lung disease. Chest x-ray was done which showed worsening pleural effusion and opacity concerning for pneumonia. No other signs of CHF, and in setting of likely pneumonia and effusion I have low suspicion for PE.  Blood cx drawn, given rocephin/azithromycin for CAP and admitted to unassigned (hospitalist) for pneumonia with failure of outpt abx, tachypnea, and pleural effusion.   Final Clinical Impressions(s) / ED Diagnoses   Final diagnoses:  Community acquired pneumonia of right lower lobe of lung ([place])  Pleural effusion    ED Discharge Orders    None       Gareth Morgan, MD 06/15/18 2102

## 2018-06-15 NOTE — H&P (Signed)
History and Physical    VENNESA Cline KVQ:[id number] DOB: Jan 27, 1929 DOA: 06/15/2018  PCP:  Cyndi Bender Consultants:  Surgeon in Ann Cline Patient coming from:  Home - lives alone; NOK: Ann Cline, [phone]  Chief Complaint: SOB  HPI: Ann Cline is a 85 y.o. female with no significant past medical history presenting with SOB.  She couldn't do anything else besides come to the ER.  She was so SOB she couldn't breathe.  It got bad about 3 days ago.  She has been a little SOB and had some discomfort with breathing but then it got worse.  She was diagnosed with PNA about 10 days and was given Levaquin x 7 days.  She completed the course on Tuesday but the SOB started right after she finished it.  +cough, productive of yellowish sputum.  No fever.  She has had chills.  No sick contacts.   ED Course:  Concern for PNA, pleural effusion.  Failure of outpatient antibiotics.  Cough x 3 weeks - given antibiotics through Tuesday.  Worsening x few days - cough productive of yellow sputum, SOB, chest tightness.  CXR with pleural effusion worse since recent xray with possible PNA.  Given Rocephin/Azithromycin.  Not on O2, but marked tachypnea with minimal movement.  Review of Systems: As per HPI; otherwise review of systems reviewed and negative.   Ambulatory Status:  Ambulates without assistance  History reviewed. No pertinent past medical history.  Past Surgical History:  Procedure Laterality Date  . ABDOMINAL HYSTERECTOMY  1975    Social History   Socioeconomic History  . Marital status: Widowed    Spouse name: Not on file  . Number of children: Not on file  . Years of education: Not on file  . Highest education level: Not on file  Occupational History  . Not on file  Social Needs  . Financial resource strain: Not on file  . Food insecurity:    Worry: Not on file    Inability: Not on file  . Transportation needs:    Medical: Not on file    Non-medical: Not on file  Tobacco  Use  . Smoking status: Never Smoker  . Smokeless tobacco: Never Used  Substance and Sexual Activity  . Alcohol use: Never    Frequency: Never  . Drug use: Never  . Sexual activity: Not on file  Lifestyle  . Physical activity:    Days per week: Not on file    Minutes per session: Not on file  . Stress: Not on file  Relationships  . Social connections:    Talks on phone: Not on file    Gets together: Not on file    Attends religious service: Not on file    Active member of club or organization: Not on file    Attends meetings of clubs or organizations: Not on file    Relationship status: Not on file  . Intimate partner violence:    Fear of current or ex partner: Not on file    Emotionally abused: Not on file    Physically abused: Not on file    Forced sexual activity: Not on file  Other Topics Concern  . Not on file  Social History Narrative  . Not on file    No Known Allergies  History reviewed. No pertinent family history.  Prior to Admission medications   Medication Sig Start Date End Date Taking? Authorizing Provider  acetaminophen (TYLENOL) 500 MG tablet Take 1,000 mg by mouth every  6 (six) hours as needed for mild pain or headache.   Yes [provider]  alendronate (FOSAMAX) 70 MG tablet Take 70 mg by mouth once a week. Tuesday 05/15/18  Yes [provider]  carboxymethylcellulose (REFRESH PLUS) 0.5 % SOLN Place 1 drop into both eyes daily as needed (dry eyes).   Yes [provider]  levofloxacin (LEVAQUIN) 500 MG tablet Take 500 mg by mouth daily. Finished on 06-11-2018 06/05/18  Yes [provider]  Multiple Vitamin (MULTIVITAMIN) capsule Take 1 capsule by mouth daily.   Yes [provider]  Omega-3 1000 MG CAPS Take 1 capsule by mouth daily.   Yes [provider]    Physical Exam: Vitals:   06/15/18 1530 06/15/18 1600 06/15/18 1615 06/15/18 1630  BP: (!) 142/74 126/77 134/73 139/84  Pulse: 86 75 88 75  Resp:  (!) 27 (!) 30 (!) 23 20  Temp:      TempSrc:      SpO2: 97% 95% 94% 95%     . General:  Appears calm and comfortable and is NAD on RA . Eyes:  PERRL, EOMI, normal lids, iris . ENT:  grossly normal hearing, lips & tongue, mmm; appropriate dentition - artificial on top and incomplete but original on the bottom . Neck:  no LAD, masses or thyromegaly; no carotid bruits . Cardiovascular:  RRR, no m/r/g. No LE edema.  Marland Kitchen Respiratory:   CTA bilaterally with diminished breath sounds in the bilateral bases.  Normal respiratory effort. . Abdomen:  soft, NT, ND, NABS . Back:   normal alignment, no CVAT . Skin:  no rash or induration seen on limited exam . Musculoskeletal:  grossly normal tone BUE/BLE, good ROM, no bony abnormality . Psychiatric:  grossly normal mood and affect, speech fluent and appropriate, AOx3 . Neurologic:  CN 2-12 grossly intact, moves all extremities in coordinated fashion, sensation intact    Radiological Exams on Admission: Dg Chest 2 View  Result Date: 06/15/2018 CLINICAL DATA:  Short of breath for 3 days. Productive cough for a week. EXAM: CHEST - 2 VIEW COMPARISON:  06/05/2018 FINDINGS: Moderate bilateral pleural effusions, increased in size from prior study. There is lung base opacity med is likely atelectasis. Can not exclude pneumonia. Upper lungs are clear.  No evidence of pulmonary edema. Cardiac silhouette is partly obscured. It is grossly normal in size. No pneumothorax. IMPRESSION: 1. Increased size of the bilateral pleural effusions since the prior study. There is associated lung base opacity consistent with atelectasis, pneumonia or a combination. 2. No evidence of pulmonary edema. Electronically Signed   By: Lajean Manes M.D.   On: 06/15/2018 13:36    EKG: Independently reviewed.  NSR with rate 81; nonspecific ST changes with no evidence of acute ischemia   Labs on Admission: I have personally reviewed the available labs and imaging studies at the time of the  admission.  Pertinent labs:   Glucose 113 GFR 59 BNP 18 Troponin 0.00 WBC 5.2 Hgb 11.9   Assessment/Plan Principal Problem:   Pneumonia Active Problems:   Hyperglycemia   PNA -Given productive cough, tachypnea with exertion, and progressive effusions/opacities in B lower lobes on chest x-ray despite prior outpatient antibiotics, most likely community-acquired pneumonia with outpatient treatment failure.  - Other etiologies include aspiration (if altered mental status was more severe than described) versus URI (most likely viral). -CURB-65 score is 2 - will observe the patient in Med Surg. -Pneumonia Severity Index (PSI) is Class 4, 9% mortality. -Corticosteroids  have been to shown to low overall mortality rate; risk of ARDS; and need for mechanical ventilation.  This is particularly true in severe PNA (class 3+ PSI).  Will add 20 mg prednisone daily for 5 days. -Will start Azithromycin 500 mg daily AND Rocephin due to no risk factors for MDR cause. -Additional complicating factors include:  failure of outpatient antibiotics;  persistent tachypnea with minimal exertion; associated pleural effusions. -NS @ 75cc/hr -Fever control -Repeat CBC in am -Sputum cultures -Blood cultures -Strep pneumo testing -Respiratory virus panel ordered to include pertussis -Will order lower respiratory tract procalcitonin level.  Antibiotics would not be indicated for PCT <0.1 and probably should not be used for < 0.25.  >0.5 indicates infection and >>0.5 indicates more serious disease.  As the procalcitonin level normalizes, it will be reasonable to consider de-escalation of antibiotic coverage. -If she does not have significant improvement overnight, she may require IR-assisted thoracentesis; however, the effusions are bilateral in nature and moderate and so she may require B drainage.  Hyperglycemia -May be stress response -Will follow with fasting AM labs -It is unlikely that she will need acute  or chronic treatment for this issue   DVT prophylaxis: Lovenox  Code Status:  Full - confirmed with patient/family Family Communication: Family present throughout evaluation  Disposition Plan:  Home once clinically improved Consults called: None  Admission status: It is my clinical opinion that referral for OBSERVATION is reasonable and necessary in this patient based on the above information provided. The aforementioned taken together are felt to place the patient at high risk for further clinical deterioration. However it is anticipated that the patient may be medically stable for discharge from the hospital within 24 to 48 hours.    Karmen Bongo MD [facility]   How to contact the [facility] Attending or Consulting provider Export or covering provider during after hours [place], for this patient?  1. Check the care team in [facility] and look for a) attending/consulting TRH provider listed and b) the [facility] team listed 2. Log into [email or web address] and use [place]'s universal password to access. If you do not have the password, please contact the hospital operator. 3. Locate the [facility] provider you are looking for under [facility] and page to a number that you can be directly reached. 4. If you still have difficulty reaching the provider, please page the [facility] (Director on Call) for the Hospitalists listed on amion for assistance.   06/15/2018, 4:51 PM

## 2018-06-16 ENCOUNTER — Encounter (HOSPITAL_COMMUNITY): Payer: Self-pay | Admitting: Radiology

## 2018-06-16 ENCOUNTER — Observation Stay (HOSPITAL_COMMUNITY): Payer: Medicare HMO

## 2018-06-16 DIAGNOSIS — C384 Malignant neoplasm of pleura: Secondary | ICD-10-CM | POA: Diagnosis not present

## 2018-06-16 DIAGNOSIS — J910 Malignant pleural effusion: Secondary | ICD-10-CM | POA: Diagnosis not present

## 2018-06-16 DIAGNOSIS — J189 Pneumonia, unspecified organism: Secondary | ICD-10-CM | POA: Diagnosis present

## 2018-06-16 DIAGNOSIS — J181 Lobar pneumonia, unspecified organism: Secondary | ICD-10-CM

## 2018-06-16 DIAGNOSIS — T380X5A Adverse effect of glucocorticoids and synthetic analogues, initial encounter: Secondary | ICD-10-CM | POA: Diagnosis not present

## 2018-06-16 DIAGNOSIS — J9601 Acute respiratory failure with hypoxia: Secondary | ICD-10-CM | POA: Diagnosis not present

## 2018-06-16 DIAGNOSIS — R0602 Shortness of breath: Secondary | ICD-10-CM | POA: Diagnosis not present

## 2018-06-16 DIAGNOSIS — Z79899 Other long term (current) drug therapy: Secondary | ICD-10-CM | POA: Diagnosis not present

## 2018-06-16 DIAGNOSIS — J918 Pleural effusion in other conditions classified elsewhere: Secondary | ICD-10-CM | POA: Diagnosis not present

## 2018-06-16 DIAGNOSIS — D509 Iron deficiency anemia, unspecified: Secondary | ICD-10-CM | POA: Diagnosis not present

## 2018-06-16 DIAGNOSIS — J90 Pleural effusion, not elsewhere classified: Secondary | ICD-10-CM | POA: Diagnosis not present

## 2018-06-16 DIAGNOSIS — R0600 Dyspnea, unspecified: Secondary | ICD-10-CM | POA: Diagnosis not present

## 2018-06-16 DIAGNOSIS — I110 Hypertensive heart disease with heart failure: Secondary | ICD-10-CM | POA: Diagnosis not present

## 2018-06-16 DIAGNOSIS — I509 Heart failure, unspecified: Secondary | ICD-10-CM | POA: Diagnosis not present

## 2018-06-16 DIAGNOSIS — M810 Age-related osteoporosis without current pathological fracture: Secondary | ICD-10-CM | POA: Diagnosis not present

## 2018-06-16 DIAGNOSIS — Y95 Nosocomial condition: Secondary | ICD-10-CM | POA: Diagnosis present

## 2018-06-16 DIAGNOSIS — R1310 Dysphagia, unspecified: Secondary | ICD-10-CM | POA: Diagnosis not present

## 2018-06-16 DIAGNOSIS — Z90710 Acquired absence of both cervix and uterus: Secondary | ICD-10-CM | POA: Diagnosis not present

## 2018-06-16 DIAGNOSIS — I5033 Acute on chronic diastolic (congestive) heart failure: Secondary | ICD-10-CM | POA: Diagnosis not present

## 2018-06-16 DIAGNOSIS — R739 Hyperglycemia, unspecified: Secondary | ICD-10-CM | POA: Diagnosis not present

## 2018-06-16 LAB — CBC WITH DIFFERENTIAL/PLATELET
Abs Immature Granulocytes: 0.01 10*3/uL (ref 0.00–0.07)
Basophils Absolute: 0 10*3/uL (ref 0.0–0.1)
Basophils Relative: 0 %
Eosinophils Absolute: 0 10*3/uL (ref 0.0–0.5)
Eosinophils Relative: 0 %
HEMATOCRIT: 34.8 % — AB (ref 36.0–46.0)
Hemoglobin: 10.6 g/dL — ABNORMAL LOW (ref 12.0–15.0)
Immature Granulocytes: 0 %
LYMPHS PCT: 18 %
Lymphs Abs: 0.7 10*3/uL (ref 0.7–4.0)
MCH: 22.6 pg — ABNORMAL LOW (ref 26.0–34.0)
MCHC: 30.5 g/dL (ref 30.0–36.0)
MCV: 74.4 fL — ABNORMAL LOW (ref 80.0–100.0)
Monocytes Absolute: 0.2 10*3/uL (ref 0.1–1.0)
Monocytes Relative: 4 %
Neutro Abs: 3 10*3/uL (ref 1.7–7.7)
Neutrophils Relative %: 78 %
Platelets: 177 10*3/uL (ref 150–400)
RBC: 4.68 MIL/uL (ref 3.87–5.11)
RDW: 13.9 % (ref 11.5–15.5)
WBC: 3.8 10*3/uL — ABNORMAL LOW (ref 4.0–10.5)
nRBC: 0 % (ref 0.0–0.2)

## 2018-06-16 LAB — FERRITIN: Ferritin: 232 ng/mL (ref 11–307)

## 2018-06-16 LAB — EXPECTORATED SPUTUM ASSESSMENT W GRAM STAIN, RFLX TO RESP C

## 2018-06-16 LAB — IRON AND TIBC
Iron: 24 ug/dL — ABNORMAL LOW (ref 28–170)
Saturation Ratios: 11 % (ref 10.4–31.8)
TIBC: 228 ug/dL — ABNORMAL LOW (ref 250–450)
UIBC: 204 ug/dL

## 2018-06-16 LAB — BASIC METABOLIC PANEL
Anion gap: 12 (ref 5–15)
BUN: 13 mg/dL (ref 8–23)
CO2: 22 mmol/L (ref 22–32)
Calcium: 8.2 mg/dL — ABNORMAL LOW (ref 8.9–10.3)
Chloride: 105 mmol/L (ref 98–111)
Creatinine, Ser: 0.93 mg/dL (ref 0.44–1.00)
GFR calc Af Amer: >60 mL/min (ref >60)
GFR calc non Af Amer: 55 mL/min — ABNORMAL LOW (ref >60)
GLUCOSE: 131 mg/dL — AB (ref 70–99)
Potassium: 4.5 mmol/L (ref 3.5–5.1)
Sodium: 139 mmol/L (ref 135–145)

## 2018-06-16 LAB — EXPECTORATED SPUTUM ASSESSMENT W REFEX TO RESP CULTURE

## 2018-06-16 MED ORDER — MULTIVITAMINS PO CAPS: 1.0000 | ORAL_CAPSULE | Freq: Every day | ORAL | Status: DC

## 2018-06-16 MED ORDER — ADULT MULTIVITAMIN W/MINERALS CH
1.0000 | ORAL_TABLET | Freq: Every day | ORAL | Status: DC
  Administered 2018-06-16 – 2018-06-20 (×5): 1 via ORAL
  Filled 2018-06-16 (×5): qty 1

## 2018-06-16 MED ORDER — IOPAMIDOL (ISOVUE-370) INJECTION 76%
100.0000 mL | Freq: Once | INTRAVENOUS | Status: AC | PRN
  Administered 2018-06-16: 100 mL via INTRAVENOUS

## 2018-06-16 MED ORDER — POLYVINYL ALCOHOL 1.4 % OP SOLN
1.0000 [drp] | OPHTHALMIC | Status: DC | PRN
  Filled 2018-06-16: qty 15

## 2018-06-16 MED ORDER — IPRATROPIUM-ALBUTEROL 0.5-2.5 (3) MG/3ML IN SOLN: 3.0000 mL | RESPIRATORY_TRACT | Status: DC | PRN

## 2018-06-16 MED ORDER — CARBOXYMETHYLCELLULOSE SODIUM 0.5 % OP SOLN: 1.0000 [drp] | Freq: Every day | OPHTHALMIC | Status: DC | PRN

## 2018-06-16 MED ORDER — IOPAMIDOL (ISOVUE-370) INJECTION 76%
INTRAVENOUS | Status: AC
  Filled 2018-06-16: qty 100

## 2018-06-16 NOTE — Progress Notes (Signed)
Report given to RN on 5W. Patients assessments remains unchanged.

## 2018-06-16 NOTE — Evaluation (Signed)
Clinical/Bedside Swallow Evaluation Patient Details  Name: Ann Cline MRN: [id number] Date of Birth: 03-27-1929  Today's Date: 06/16/2018 Time: SLP Start Time (ACUTE ONLY): 0945 SLP Stop Time (ACUTE ONLY): 1010 SLP Time Calculation (min) (ACUTE ONLY): 25 min  Past Medical History: History reviewed. No pertinent past medical history. Past Surgical History:  Past Surgical History:  Procedure Laterality Date  . ABDOMINAL HYSTERECTOMY  1975   HPI:  Ann Cline is a 85 y.o. female with no significant past medical history presenting with SOB. She was diagnosed with PNA about 10 days and was given Levaquin x 7 days. CXR with pleural effusion worse since recent xray with possible PNA. Swallow evaluation ordered d/t pt complaining of difficulty swallowing liquids.    Assessment / Plan / Recommendation Clinical Impression   Patient presents with delayed cough after larger volumes of intake; SLP questions primary esophageal vs oropharyngeal dysphagia based on symptoms. She complains of globus sensation between sternal notch and mid-sternum; "Sometimes it feels like it won't go down all the way unless I move." She states she occasionally has trouble coughing with liquids (about once a month) if she drinks something too fast. Globus sensation worse with solids vs liquids during assessment today; pt reports this eases with liquid wash. As pt with tachypnea with minimal exertion, advise downgrade to dys 3, thin liquids, meds whole with liquid with precautions: slow rate, small bites and sips, and rest breaks when short of breath. Esophageal precautions: upright for meals and 30 minutes after eating. Will follow up for tolerance.  SLP Visit Diagnosis: Dysphagia, unspecified (R13.10)    Aspiration Risk  Mild aspiration risk    Diet Recommendation Dysphagia 3 (Mech soft);Thin liquid   Liquid Administration via: Cup;Straw Medication Administration: Whole meds with liquid Supervision: Patient able  to self feed Compensations: Slow rate;Small sips/bites(take rest breaks) Postural Changes: Seated upright at 90 degrees;Remain upright for at least 30 minutes after po intake    Other  Recommendations Oral Care Recommendations: Oral care BID   Follow up Recommendations Other (comment)(tba)      Frequency and Duration min 1 x/week  1 week       Prognosis Prognosis for Safe Diet Advancement: Good      Swallow Study   General Date of Onset: 06/15/18 HPI: Ann Cline is a 85 y.o. female with no significant past medical history presenting with SOB. She was diagnosed with PNA about 10 days and was given Levaquin x 7 days. CXR with pleural effusion worse since recent xray with possible PNA. Swallow evaluation ordered d/t pt complaining of difficulty swallowing liquids.  Type of Study: Bedside Swallow Evaluation Previous Swallow Assessment: none in chart  Temperature Spikes Noted: No Respiratory Status: Nasal cannula History of Recent Intubation: No Behavior/Cognition: Alert;Cooperative Oral Cavity Assessment: Within Functional Limits Oral Care Completed by SLP: No Oral Cavity - Dentition: Other (Comment)(partial, top and bottom) Vision: Functional for self-feeding Self-Feeding Abilities: Able to feed self Patient Positioning: Upright in bed Baseline Vocal Quality: Normal Volitional Cough: Strong Volitional Swallow: Able to elicit    Oral/Motor/Sensory Function Overall Oral Motor/Sensory Function: Within functional limits   Ice Chips Ice chips: Not tested   Thin Liquid Thin Liquid: Impaired Pharyngeal  Phase Impairments: Cough - Delayed;Other (comments)(initially WFL, delayed coughing after larger volume of intak)    Nectar Thick Nectar Thick Liquid: Not tested   Honey Thick Honey Thick Liquid: Not tested   Puree     Solid     Solid: Impaired  Pharyngeal Phase Impairments: Cough - Delayed     Deneise Lever, [place], CCC-SLP Speech-Language Pathologist Acute  Rehabilitation Services Pager: [phone] Office: [phone]  Aliene Altes 06/16/2018,10:19 AM

## 2018-06-16 NOTE — Progress Notes (Signed)
Pt resting comfortable. Pt remains on 2 liter of oxygen via nasal cannula. Pt has constant cough throughout the night. While ambulating to bathroom, patient becomes short of breath and begins wheezing bilateral. Pt states no longer feels generalized weakness. Pt assisted back to bed. Call light within reach.

## 2018-06-16 NOTE — Progress Notes (Addendum)
Care RN received a called from the [employer] to verify pt's hospitalization in order for daughter, who is stationed in [place], to come home. Received permission from the pt to give information to the [employer].  [employer] contact number is: [phone] and pt's reference/case number is [id number]

## 2018-06-16 NOTE — Progress Notes (Signed)
Progress Note    Ann Cline  DEY:[id number] DOB: [birth year]  DOA: 06/15/2018 PCP: Patient, No Pcp Per    Brief Narrative:    Medical records reviewed and are as summarized below:  Ann Cline is an 85 y.o. female with no significant past medical history presenting with SOB.  She couldn't do anything else besides come to the ER.   She has been a little SOB and had some discomfort with breathing but then it got worse.  She was diagnosed with PNA about 10 days and was given Levaquin x 7 days.  She completed the course on Tuesday but the SOB started right after she finished it.    Assessment/Plan:   Principal Problem:   Pneumonia Active Problems:   Hyperglycemia  Dyspnea due to PNA -could be community-acquired pneumonia with outpatient treatment failure -treated with levaquin -r/o aspiration contributions (says she has trouble with liquids at times) - 20 mg prednisone daily for 5 days. - Azithromycin 500 mg daily AND Rocephin due to no risk factors for MDR cause. -Sputum cultures -Blood cultures -Strep pneumo testing negative  -Respiratory virus panel negative -procalcitonin negative -will get CTA to r/o PE and get a better look at effusions -may need U/S guided thoracentesis for symptoms relief and diagnosis in AM pending CT results -PRN nebs -BNP 18 so doubt heart failure (no prior echo in results-- will not order yet) -need to mobilize out of bed often  Acute respiratory failure- hypoxia -wean O2 as tolerated -will see if can get O2 sat on room air documented  Hyperglycemia -May be stress response vs steroids -check HgbA1c and monitor closely  Anemia- unknown cause -limited prior labs -check Fe panel in AM   Family Communication/Anticipated D/C date and plan/Code Status   DVT prophylaxis: Lovenox ordered. Code Status: Full Code.  Family Communication: granddaughter at bedside Disposition Plan:    Medical Consultants:     None.    Subjective:   Short of breath with minimal exertion such as talking or re-positioning in bed  Objective:    Vitals:   06/15/18 1630 06/15/18 1736 06/15/18 2324 06/16/18 0615  BP: 139/84 127/76 130/74 122/74  Pulse: 75 78 79 67  Resp: 20 16 (!) 22 18  Temp:  99 F (37.2 C) 98.1 F (36.7 C) 98.4 F (36.9 C)  TempSrc:  Oral Oral Oral  SpO2: 95% 98% 97% 98%    Intake/Output Summary (Last 24 hours) at 06/16/2018 3149 Last data filed at 06/15/2018 2000 Gross per 24 hour  Intake 612.77 ml  Output -  Net 612.77 ml   There were no vitals filed for this visit.  Exam: Younger than stated age, appears to be working hard to breathe rrr Diminished in b/l bases-- no wheezing Alert and oriented-- mildly hard of hearing Moves all 4 ext    Data Reviewed:   I have personally reviewed following labs and imaging studies:  Labs: Labs show the following:   Basic Metabolic Panel: Recent Labs  Lab 06/15/18 1245 06/16/18 0239  NA 138 139  K 4.5 4.5  CL 105 105  CO2 24 22  GLUCOSE 113* 131*  BUN 13 13  CREATININE 1.00 0.93  CALCIUM 8.7* 8.2*   GFR CrCl cannot be calculated (Unknown ideal weight.). Liver Function Tests: Recent Labs  Lab 06/15/18 1245  AST 21  ALT 16  ALKPHOS 101  BILITOT 0.7  PROT 6.5  ALBUMIN 3.4*   No results for input(s): LIPASE, AMYLASE in  the last 168 hours. No results for input(s): AMMONIA in the last 168 hours. Coagulation profile No results for input(s): INR, PROTIME in the last 168 hours.  CBC: Recent Labs  Lab 06/15/18 1245 06/16/18 0239  WBC 5.2 3.8*  NEUTROABS 3.4 3.0  HGB 11.9* 10.6*  HCT 39.2 34.8*  MCV 75.1* 74.4*  PLT 188 177   Cardiac Enzymes: No results for input(s): CKTOTAL, CKMB, CKMBINDEX, TROPONINI in the last 168 hours. BNP (last 3 results) No results for input(s): PROBNP in the last 8760 hours. CBG: No results for input(s): GLUCAP in the last 168 hours. D-Dimer: No results for input(s): DDIMER  in the last 72 hours. Hgb A1c: No results for input(s): HGBA1C in the last 72 hours. Lipid Profile: No results for input(s): CHOL, HDL, LDLCALC, TRIG, CHOLHDL, LDLDIRECT in the last 72 hours. Thyroid function studies: No results for input(s): TSH, T4TOTAL, T3FREE, THYROIDAB in the last 72 hours.  Invalid input(s): FREET3 Anemia work up: No results for input(s): VITAMINB12, FOLATE, FERRITIN, TIBC, IRON, RETICCTPCT in the last 72 hours. Sepsis Labs: Recent Labs  Lab 06/15/18 1245 06/15/18 1523 06/15/18 1608 06/16/18 0239  PROCALCITON <0.10  --   --   --   WBC 5.2  --   --  3.8*  LATICACIDVEN  --  1.1 1.1  --     Microbiology Recent Results (from the past 240 hour(s))  Blood culture (routine x 2)     Status: None (Preliminary result)   Collection Time: 06/15/18  2:47 PM  Result Value Ref Range Status   Specimen Description BLOOD RIGHT ANTECUBITAL  Final   Special Requests   Final    BOTTLES DRAWN AEROBIC AND ANAEROBIC Blood Culture adequate volume   Culture NO GROWTH < 24 HOURS  Final   Report Status PENDING  Incomplete  Blood culture (routine x 2)     Status: None (Preliminary result)   Collection Time: 06/15/18  4:08 PM  Result Value Ref Range Status   Specimen Description BLOOD RIGHT ANTECUBITAL  Final   Special Requests   Final    BOTTLES DRAWN AEROBIC AND ANAEROBIC Blood Culture adequate volume   Culture NO GROWTH < 24 HOURS  Final   Report Status PENDING  Incomplete  Respiratory Panel by PCR     Status: None   Collection Time: 06/15/18  5:45 PM  Result Value Ref Range Status   Adenovirus NOT DETECTED NOT DETECTED Final   Coronavirus 229E NOT DETECTED NOT DETECTED Final   Coronavirus HKU1 NOT DETECTED NOT DETECTED Final   Coronavirus NL63 NOT DETECTED NOT DETECTED Final   Coronavirus OC43 NOT DETECTED NOT DETECTED Final   Metapneumovirus NOT DETECTED NOT DETECTED Final   Rhinovirus / Enterovirus NOT DETECTED NOT DETECTED Final   Influenza A NOT DETECTED NOT  DETECTED Final   Influenza B NOT DETECTED NOT DETECTED Final   Parainfluenza Virus 1 NOT DETECTED NOT DETECTED Final   Parainfluenza Virus 2 NOT DETECTED NOT DETECTED Final   Parainfluenza Virus 3 NOT DETECTED NOT DETECTED Final   Parainfluenza Virus 4 NOT DETECTED NOT DETECTED Final   Respiratory Syncytial Virus NOT DETECTED NOT DETECTED Final   Bordetella pertussis NOT DETECTED NOT DETECTED Final   Chlamydophila pneumoniae NOT DETECTED NOT DETECTED Final   Mycoplasma pneumoniae NOT DETECTED NOT DETECTED Final    Comment: Performed at [facility], [place] [address], [place], [place] [address]  Culture, sputum-assessment     Status: None   Collection Time: 06/15/18  8:21 PM  Result Value Ref Range Status   Specimen Description SPUTUM  Final   Special Requests NONE  Final   Sputum evaluation   Final    Sputum specimen not acceptable for testing.  Please recollect.   RESULT CALLED TO, READ BACK BY AND VERIFIED WITH: RN Hulda Marin [phone]    Report Status 06/16/2018 FINAL  Final    Procedures and diagnostic studies:  Dg Chest 2 View  Result Date: 06/15/2018 CLINICAL DATA:  Short of breath for 3 days. Productive cough for a week. EXAM: CHEST - 2 VIEW COMPARISON:  06/05/2018 FINDINGS: Moderate bilateral pleural effusions, increased in size from prior study. There is lung base opacity med is likely atelectasis. Can not exclude pneumonia. Upper lungs are clear.  No evidence of pulmonary edema. Cardiac silhouette is partly obscured. It is grossly normal in size. No pneumothorax. IMPRESSION: 1. Increased size of the bilateral pleural effusions since the prior study. There is associated lung base opacity consistent with atelectasis, pneumonia or a combination. 2. No evidence of pulmonary edema. Electronically Signed   By: Lajean Manes M.D.   On: 06/15/2018 13:36    Medications:   . enoxaparin (LOVENOX) injection  40 mg Subcutaneous Q24H  . predniSONE  20 mg Oral Q breakfast    Continuous Infusions: . sodium chloride 75 mL/hr at 06/16/18 0033  . azithromycin    . cefTRIAXone (ROCEPHIN)  IV       LOS: 0 days   Ann Cline  [facility]   How to contact the [facility] Attending or Consulting provider [place] or covering provider during after hours [place], for this patient?  1. Check the care team in [facility] and look for a) attending/consulting [facility] provider listed and b) the [facility] team listed 2. Log into [email or web address] and use [facility]'s universal password to access. If you do not have the password, please contact the hospital operator. 3. Locate the [facility] provider you are looking for under [facility] and page to a number that you can be directly reached. 4. If you still have difficulty reaching the provider, please page the [facility] (Director on Call) for the Hospitalists listed on amion for assistance.  06/16/2018, 8:34 AM

## 2018-06-17 ENCOUNTER — Inpatient Hospital Stay (HOSPITAL_COMMUNITY): Payer: Medicare HMO

## 2018-06-17 ENCOUNTER — Encounter (HOSPITAL_COMMUNITY): Payer: Self-pay | Admitting: Student

## 2018-06-17 DIAGNOSIS — R0602 Shortness of breath: Secondary | ICD-10-CM

## 2018-06-17 DIAGNOSIS — I509 Heart failure, unspecified: Secondary | ICD-10-CM

## 2018-06-17 HISTORY — PX: IR THORACENTESIS ASP PLEURAL SPACE W/IMG GUIDE: IMG5380

## 2018-06-17 LAB — BASIC METABOLIC PANEL
Anion gap: 9 (ref 5–15)
BUN: 15 mg/dL (ref 8–23)
CO2: 23 mmol/L (ref 22–32)
Calcium: 8.2 mg/dL — ABNORMAL LOW (ref 8.9–10.3)
Chloride: 108 mmol/L (ref 98–111)
Creatinine, Ser: 0.98 mg/dL (ref 0.44–1.00)
GFR calc Af Amer: >60 mL/min (ref >60)
GFR calc non Af Amer: 52 mL/min — ABNORMAL LOW (ref >60)
GLUCOSE: 107 mg/dL — AB (ref 70–99)
Potassium: 4 mmol/L (ref 3.5–5.1)
Sodium: 140 mmol/L (ref 135–145)

## 2018-06-17 LAB — CBC
HCT: 35.5 % — ABNORMAL LOW (ref 36.0–46.0)
Hemoglobin: 11 g/dL — ABNORMAL LOW (ref 12.0–15.0)
MCH: 23.2 pg — ABNORMAL LOW (ref 26.0–34.0)
MCHC: 31 g/dL (ref 30.0–36.0)
MCV: 74.9 fL — ABNORMAL LOW (ref 80.0–100.0)
Platelets: 172 10*3/uL (ref 150–400)
RBC: 4.74 MIL/uL (ref 3.87–5.11)
RDW: 13.9 % (ref 11.5–15.5)
WBC: 6.4 10*3/uL (ref 4.0–10.5)
nRBC: 0 % (ref 0.0–0.2)

## 2018-06-17 LAB — BODY FLUID CELL COUNT WITH DIFFERENTIAL
Eos, Fluid: 1 %
Lymphs, Fluid: 51 %
Monocyte-Macrophage-Serous Fluid: 14 % — ABNORMAL LOW (ref 50–90)
Neutrophil Count, Fluid: 34 % — ABNORMAL HIGH (ref 0–25)
Total Nucleated Cell Count, Fluid: 785 cu mm (ref 0–1000)

## 2018-06-17 LAB — HEMOGLOBIN A1C
Hgb A1c MFr Bld: 5.9 % — ABNORMAL HIGH (ref 4.8–5.6)
Mean Plasma Glucose: 122.63 mg/dL

## 2018-06-17 LAB — PROTEIN, PLEURAL OR PERITONEAL FLUID: TOTAL PROTEIN, FLUID: 4 g/dL

## 2018-06-17 LAB — GRAM STAIN

## 2018-06-17 LAB — HEPATIC FUNCTION PANEL
ALT: 14 U/L (ref 0–44)
AST: 19 U/L (ref 15–41)
Albumin: 2.9 g/dL — ABNORMAL LOW (ref 3.5–5.0)
Alkaline Phosphatase: 88 U/L (ref 38–126)
Bilirubin, Direct: 0.1 mg/dL (ref 0.0–0.2)
Indirect Bilirubin: 0.6 mg/dL (ref 0.3–0.9)
Total Bilirubin: 0.7 mg/dL (ref 0.3–1.2)
Total Protein: 5.9 g/dL — ABNORMAL LOW (ref 6.5–8.1)

## 2018-06-17 LAB — LACTATE DEHYDROGENASE, PLEURAL OR PERITONEAL FLUID: LD, Fluid: 142 U/L — ABNORMAL HIGH (ref 3–23)

## 2018-06-17 LAB — LACTATE DEHYDROGENASE: LDH: 96 U/L — ABNORMAL LOW (ref 98–192)

## 2018-06-17 LAB — ECHOCARDIOGRAM COMPLETE

## 2018-06-17 MED ORDER — FERROUS SULFATE 325 (65 FE) MG PO TABS
325.0000 mg | ORAL_TABLET | Freq: Two times a day (BID) | ORAL | Status: DC
  Administered 2018-06-17 – 2018-06-20 (×6): 325 mg via ORAL
  Filled 2018-06-17 (×6): qty 1

## 2018-06-17 MED ORDER — ACETAMINOPHEN 325 MG PO TABS
650.0000 mg | ORAL_TABLET | Freq: Four times a day (QID) | ORAL | Status: DC | PRN
  Administered 2018-06-18 – 2018-06-20 (×3): 650 mg via ORAL
  Filled 2018-06-17 (×3): qty 2

## 2018-06-17 MED ORDER — TRAMADOL HCL 50 MG PO TABS
50.0000 mg | ORAL_TABLET | Freq: Once | ORAL | Status: DC | PRN
  Filled 2018-06-17: qty 1

## 2018-06-17 MED ORDER — LIDOCAINE HCL (PF) 1 % IJ SOLN
INTRAMUSCULAR | Status: DC | PRN
  Administered 2018-06-17: 10 mL

## 2018-06-17 MED ORDER — FUROSEMIDE 10 MG/ML IJ SOLN
60.0000 mg | Freq: Once | INTRAMUSCULAR | Status: AC
  Administered 2018-06-17: 60 mg via INTRAVENOUS

## 2018-06-17 MED ORDER — METOLAZONE 2.5 MG PO TABS
2.5000 mg | ORAL_TABLET | Freq: Once | ORAL | Status: AC
  Administered 2018-06-17: 2.5 mg via ORAL
  Filled 2018-06-17: qty 1

## 2018-06-17 MED ORDER — FUROSEMIDE 10 MG/ML IJ SOLN
INTRAMUSCULAR | Status: AC
  Filled 2018-06-17: qty 4

## 2018-06-17 MED ORDER — LIDOCAINE HCL 1 % IJ SOLN
INTRAMUSCULAR | Status: AC
  Filled 2018-06-17: qty 20

## 2018-06-17 MED ORDER — FUROSEMIDE 10 MG/ML IJ SOLN
INTRAMUSCULAR | Status: AC
  Filled 2018-06-17: qty 2

## 2018-06-17 MED ORDER — HYDRALAZINE HCL 25 MG PO TABS
25.0000 mg | ORAL_TABLET | Freq: Three times a day (TID) | ORAL | Status: DC
  Administered 2018-06-17 (×2): 25 mg via ORAL
  Filled 2018-06-17 (×4): qty 1

## 2018-06-17 MED ORDER — NITROGLYCERIN 2 % TD OINT
0.5000 [in_us] | TOPICAL_OINTMENT | Freq: Four times a day (QID) | TRANSDERMAL | Status: DC
  Administered 2018-06-17 – 2018-06-18 (×4): 0.5 [in_us] via TOPICAL
  Filled 2018-06-17: qty 30

## 2018-06-17 MED ORDER — CARVEDILOL 3.125 MG PO TABS
3.1250 mg | ORAL_TABLET | Freq: Two times a day (BID) | ORAL | Status: DC
  Administered 2018-06-17 – 2018-06-20 (×4): 3.125 mg via ORAL
  Filled 2018-06-17 (×6): qty 1

## 2018-06-17 MED ORDER — HYDRALAZINE HCL 20 MG/ML IJ SOLN: 10.0000 mg | Freq: Four times a day (QID) | INTRAMUSCULAR | Status: DC | PRN

## 2018-06-17 NOTE — Progress Notes (Addendum)
PROGRESS NOTE                                                                                                                                                                                                             Patient Demographics:    Ann Cline, is a 85 y.o. female, DOB - 1928-07-23, IWL:[id number]  Admit date - 06/15/2018   Admitting Physician Karmen Bongo, MD  Outpatient Primary MD for the patient is Patient, No Pcp Per  LOS - 1  Chief Complaint  Patient presents with  . Shortness of Breath  . Cough  . Nausea       Brief Narrative  ALAZNE Cline is an 85 y.o. female withno significant pastmedical historypresenting with SOB.She couldn't do anything else besides come to the ER.  She has been a little SOB and had some discomfort with breathing but then it got worse. She was diagnosed with PNA about 10 days and was given Levaquin x 7 days. She completed the course on Tuesday but the SOB started right after she finished it.    Subjective:    Threasa Heads today has, No headache, No chest pain, No abdominal pain - No Nausea, No new weakness tingling or numbness, positive orthopnea, wheezing and SOB.   Assessment  & Plan :     1.  Acute hypoxic respiratory failure due to acute on chronic CHF nonspecific.  No previous echo in file, stop IV fluids and IV antibiotics, placed her on Nitropaste along with high-dose IV Lasix, will add Coreg along with low-dose hydralazine, agree with ultrasound-guided thoracentesis for diagnostic and therapeutic purposes as she is quite short of breath.  Expect this to be transudative.  CT angiogram chest unremarkable.  Does not appear to be infected. Continue supportive care with oxygen and nebulizer treatment.  Echo ordered.  2.  Dysphagia.  Speech following currently on dysphagia 3 diet.  3.  Essential hypertension.  Placed on Coreg and hydralazine scheduled along with PRN IV hydralazine.  Also Nitropaste on  board.  4.  Steroid-induced hyperglycemia.  Stopped steroids.  Will monitor.  5.  History of osteoporosis.  Resume home medication upon discharge.  6.  Mild chronic microcytic iron deficiency anemia.  Placed on oral iron, outpatient GI follow-up for age-appropriate outpatient work-up.    Family Communication  :  None  Code Status :  Full  Disposition Plan  :  TBD  Consults  : None  Procedures  :    Ultrasound-guided  thoracentesis requested on 06/17/2018.  DVT Prophylaxis  :  Lovenox   Lab Results  Component Value Date   PLT 172 06/17/2018    Diet :  Diet Order            DIET DYS 3 Room service appropriate? Yes; Fluid consistency: Thin  Diet effective now               Inpatient Medications Scheduled Meds: . carvedilol  3.125 mg Oral BID WC  . enoxaparin (LOVENOX) injection  40 mg Subcutaneous Q24H  . hydrALAZINE  25 mg Oral Q8H  . multivitamin with minerals  1 tablet Oral Daily  . nitroGLYCERIN  0.5 inch Topical Q6H  . predniSONE  20 mg Oral Q breakfast   Continuous Infusions:  PRN Meds:.hydrALAZINE, ipratropium-albuterol, polyvinyl alcohol  Antibiotics  :   Anti-infectives (From admission, onward)   Start     Dose/Rate Route Frequency Ordered Stop   06/16/18 1200  cefTRIAXone (ROCEPHIN) 1 g in sodium chloride 0.9 % 100 mL IVPB  Status:  Discontinued     1 g 200 mL/hr over 30 Minutes Intravenous Every 24 hours 06/15/18 1647 06/17/18 1024   06/16/18 1200  azithromycin (ZITHROMAX) 500 mg in sodium chloride 0.9 % 250 mL IVPB  Status:  Discontinued     500 mg 250 mL/hr over 60 Minutes Intravenous Every 24 hours 06/15/18 1647 06/17/18 1024   06/15/18 1500  cefTRIAXone (ROCEPHIN) 1 g in sodium chloride 0.9 % 100 mL IVPB     1 g 200 mL/hr over 30 Minutes Intravenous  Once 06/15/18 1447 06/15/18 1640   06/15/18 1500  azithromycin (ZITHROMAX) 500 mg in sodium chloride 0.9 % 250 mL IVPB     500 mg 250 mL/hr over 60 Minutes Intravenous  Once 06/15/18 1447  06/15/18 1741          Objective:   Vitals:   06/16/18 1135 06/16/18 1720 06/16/18 2223 06/17/18 0407  BP: 111/81 134/78 140/84 (!) 158/93  Pulse: 78 89 78 66  Resp:  15 18 20   Temp: 98.6 F (37 C) 98.3 F (36.8 C) 98.3 F (36.8 C) 98.3 F (36.8 C)  TempSrc: Oral Oral Oral Oral  SpO2: 94% 94% 100% 98%    Wt Readings from Last 3 Encounters:  No data found for Wt    No intake or output data in the 24 hours ending 06/17/18 1026   Physical Exam  Awake Alert, Oriented X 3, No new F.N deficits, Normal affect [place].AT,PERRAL Supple Neck,No JVD, No cervical lymphadenopathy appriciated.  Symmetrical Chest wall movement, Mod air movement bilaterally, bibasilar Rales with diffuse wheezes RRR,No Gallops,Rubs or new Murmurs, No Parasternal Heave +ve B.Sounds, Abd Soft, No tenderness, No organomegaly appriciated, No rebound - guarding or rigidity. No Cyanosis, Clubbing or edema, No new Rash or bruise       Data Review:    CBC Recent Labs  Lab 06/15/18 1245 06/16/18 0239 06/17/18 0519  WBC 5.2 3.8* 6.4  HGB 11.9* 10.6* 11.0*  HCT 39.2 34.8* 35.5*  PLT 188 177 172  MCV 75.1* 74.4* 74.9*  MCH 22.8* 22.6* 23.2*  MCHC 30.4 30.5 31.0  RDW 13.7 13.9 13.9  LYMPHSABS 1.2 0.7  --   MONOABS 0.6 0.2  --   EOSABS 0.1 0.0  --   BASOSABS 0.0 0.0  --     Chemistries  Recent Labs  Lab 06/15/18 1245 06/16/18 0239 06/17/18 0519  NA 138 139 140  K 4.5 4.5 4.0  CL 105 105 108  CO2 24 22 23   GLUCOSE 113* 131* 107*  BUN 13 13 15   CREATININE 1.00 0.93 0.98  CALCIUM 8.7* 8.2* 8.2*  AST 21  --  19  ALT 16  --  14  ALKPHOS 101  --  88  BILITOT 0.7  --  0.7   ------------------------------------------------------------------------------------------------------------------ No results for input(s): CHOL, HDL, LDLCALC, TRIG, CHOLHDL, LDLDIRECT in the last 72 hours.  Lab Results  Component Value Date   HGBA1C 5.9 (H) 06/17/2018    ------------------------------------------------------------------------------------------------------------------ No results for input(s): TSH, T4TOTAL, T3FREE, THYROIDAB in the last 72 hours.  Invalid input(s): FREET3 ------------------------------------------------------------------------------------------------------------------ Recent Labs    06/16/18 1006  FERRITIN 232  TIBC 228*  IRON 24*    Coagulation profile No results for input(s): INR, PROTIME in the last 168 hours.  No results for input(s): DDIMER in the last 72 hours.  Cardiac Enzymes No results for input(s): CKMB, TROPONINI, MYOGLOBIN in the last 168 hours.  Invalid input(s): CK ------------------------------------------------------------------------------------------------------------------    Component Value Date/Time   BNP 18.0 06/15/2018 1245    Micro Results Recent Results (from the past 240 hour(s))  Blood culture (routine x 2)     Status: None (Preliminary result)   Collection Time: 06/15/18  2:47 PM  Result Value Ref Range Status   Specimen Description BLOOD RIGHT ANTECUBITAL  Final   Special Requests   Final    BOTTLES DRAWN AEROBIC AND ANAEROBIC Blood Culture adequate volume   Culture   Final    NO GROWTH 2 DAYS Performed at [facility], [address] [place], [place] [address]    Report Status PENDING  Incomplete  Blood culture (routine x 2)     Status: None (Preliminary result)   Collection Time: 06/15/18  4:08 PM  Result Value Ref Range Status   Specimen Description BLOOD RIGHT ANTECUBITAL  Final   Special Requests   Final    BOTTLES DRAWN AEROBIC AND ANAEROBIC Blood Culture adequate volume   Culture   Final    NO GROWTH 2 DAYS Performed at [facility], [place] [address], [place], [place] [address]    Report Status PENDING  Incomplete  Respiratory Panel by PCR     Status: None   Collection Time: 06/15/18  5:45 PM  Result Value Ref Range Status   Adenovirus NOT  DETECTED NOT DETECTED Final   Coronavirus 229E NOT DETECTED NOT DETECTED Final   Coronavirus HKU1 NOT DETECTED NOT DETECTED Final   Coronavirus NL63 NOT DETECTED NOT DETECTED Final   Coronavirus OC43 NOT DETECTED NOT DETECTED Final   Metapneumovirus NOT DETECTED NOT DETECTED Final   Rhinovirus / Enterovirus NOT DETECTED NOT DETECTED Final   Influenza A NOT DETECTED NOT DETECTED Final   Influenza B NOT DETECTED NOT DETECTED Final   Parainfluenza Virus 1 NOT DETECTED NOT DETECTED Final   Parainfluenza Virus 2 NOT DETECTED NOT DETECTED Final   Parainfluenza Virus 3 NOT DETECTED NOT DETECTED Final   Parainfluenza Virus 4 NOT DETECTED NOT DETECTED Final   Respiratory Syncytial Virus NOT DETECTED NOT DETECTED Final   Bordetella pertussis NOT DETECTED NOT DETECTED Final   Chlamydophila pneumoniae NOT DETECTED NOT DETECTED Final   Mycoplasma pneumoniae NOT DETECTED NOT DETECTED Final    Comment: Performed at [facility], [place] [address] [place], [place] [address]  Culture, sputum-assessment     Status: None   Collection Time: 06/15/18  8:21 PM  Result Value Ref Range Status  Specimen Description SPUTUM  Final   Special Requests NONE  Final   Sputum evaluation   Final    Sputum specimen not acceptable for testing.  Please recollect.   RESULT CALLED TO, READ BACK BY AND VERIFIED WITH: RN Hulda Marin [phone]    Report Status 06/16/2018 FINAL  Final  Expectorated sputum assessment w rflx to resp cult     Status: None   Collection Time: 06/16/18  8:07 AM  Result Value Ref Range Status   Specimen Description EXPECTORATED SPUTUM  Final   Special Requests NONE  Final   Sputum evaluation   Final    THIS SPECIMEN IS ACCEPTABLE FOR SPUTUM CULTURE Performed at [facility], [place] [address], [place], [place] [address]    Report Status 06/16/2018 FINAL  Final  Culture, respiratory     Status: None (Preliminary result)   Collection Time: 06/16/18  8:07 AM  Result Value Ref Range  Status   Specimen Description EXPECTORATED SPUTUM  Final   Special Requests   Final    NONE Reflexed from [id number] Performed at [facility], [place] [address] [place], [place] [address]    Gram Stain   Final    FEW SQUAMOUS EPITHELIAL CELLS PRESENT FEW WBC PRESENT, PREDOMINANTLY PMN FEW GRAM POSITIVE COCCI RARE GRAM NEGATIVE RODS RARE GRAM POSITIVE RODS    Culture PENDING  Incomplete   Report Status PENDING  Incomplete    Radiology Reports Dg Chest 2 View  Result Date: 06/15/2018 CLINICAL DATA:  Short of breath for 3 days. Productive cough for a week. EXAM: CHEST - 2 VIEW COMPARISON:  06/05/2018 FINDINGS: Moderate bilateral pleural effusions, increased in size from prior study. There is lung base opacity med is likely atelectasis. Can not exclude pneumonia. Upper lungs are clear.  No evidence of pulmonary edema. Cardiac silhouette is partly obscured. It is grossly normal in size. No pneumothorax. IMPRESSION: 1. Increased size of the bilateral pleural effusions since the prior study. There is associated lung base opacity consistent with atelectasis, pneumonia or a combination. 2. No evidence of pulmonary edema. Electronically Signed   By: Lajean Manes M.D.   On: 06/15/2018 13:36   Dg Chest 2 View  Result Date: 06/05/2018 CLINICAL DATA:  85 year old female with a history of pleural effusion on prior ultrasound abdomen EXAM: CHEST - 2 VIEW COMPARISON:  None. FINDINGS: Cardiomediastinal silhouette within normal limits. Opacities at the bilateral lung bases with obscuration of the hemidiaphragm, blunting of the costophrenic angles and blunting of the costophrenic sulcus. Patchy airspace opacities bilateral lung bases. No pneumothorax. Thickening of the fissures. IMPRESSION: Bilateral pleural effusions with associated patchy opacities, potentially representing atelectasis, scarring, or consolidation/infection. Electronically Signed   By: Corrie Mckusick D.O.   On: 06/05/2018 13:00   Ct Angio  Chest Pe W Or Wo Contrast  Result Date: 06/16/2018 CLINICAL DATA:  Shortness of breath. EXAM: CT ANGIOGRAPHY CHEST WITH CONTRAST TECHNIQUE: Multidetector CT imaging of the chest was performed using the standard protocol during bolus administration of intravenous contrast. Multiplanar CT image reconstructions and MIPs were obtained to evaluate the vascular anatomy. CONTRAST:  151mL ISOVUE-370 IOPAMIDOL (ISOVUE-370) INJECTION 76% COMPARISON:  Chest radiograph, 06/15/2018 FINDINGS: Cardiovascular: There is satisfactory opacification of the pulmonary arteries to the segmental level. There is no evidence of a pulmonary embolism. Heart is normal in size and configuration. No pericardial effusion. No coronary artery calcifications. Great vessels are normal in caliber. No aortic dissection or atherosclerosis. Mediastinum/Nodes: No mediastinal or hilar  masses. No enlarged lymph nodes. Trachea and esophagus are unremarkable. There is increased attenuation in the mediastinal and hilar fat consistent with edema. Lungs/Pleura: Large bilateral pleural effusions. Complete atelectasis of both lower lobes. Mild dependent atelectasis noted in the upper lobes and right middle lobe. Minor areas of ground-glass opacity in the superior aspect of the upper lobes, also likely atelectasis. No convincing pneumonia or pulmonary edema. No lung mass or suspicious nodule. No pneumothorax. Upper Abdomen: No acute abnormality. Musculoskeletal: No fracture or acute finding. No osteoblastic or osteolytic lesions. Review of the MIP images confirms the above findings. IMPRESSION: 1. No evidence of a pulmonary embolism. 2. Large bilateral pleural effusions with complete lower lobe atelectasis bilaterally. Mild dependent atelectasis in the upper lobes and right middle lobe. 3. No convincing pneumonia or pulmonary edema. Electronically Signed   By: Lajean Manes M.D.   On: 06/16/2018 11:42   Mm Digital Screening Bilateral  Result Date:  05/23/2018 CLINICAL DATA:  Screening. EXAM: DIGITAL SCREENING BILATERAL MAMMOGRAM WITH CAD COMPARISON:  Previous exam(s). ACR Breast Density Category c: The breast tissue is heterogeneously dense, which may obscure small masses. FINDINGS: There are no findings suspicious for malignancy. Images were processed with CAD. IMPRESSION: No mammographic evidence of malignancy. A result letter of this screening mammogram will be mailed directly to the patient. RECOMMENDATION: Screening mammogram in one year. (Code:SM-B-01Y) BI-RADS CATEGORY  1: Negative. Electronically Signed   By: Lovey Newcomer M.D.   On: 05/23/2018 16:00    Time Spent in minutes  30   Lala Lund M.D on 06/17/2018 at 10:26 AM  To page go to [email or web address] - password [facility]

## 2018-06-17 NOTE — Progress Notes (Signed)
SLP Cancellation Note  Patient Details Name: Ann Cline MRN: [id number] DOB: October 22, 1928   Cancelled treatment:       Reason Eval/Treat Not Completed: Patient at procedure or test/unavailable   Jaemarie Hochberg, Katherene Ponto 06/17/2018, 2:44 PM

## 2018-06-17 NOTE — Plan of Care (Signed)

## 2018-06-17 NOTE — Progress Notes (Signed)
PROCEDURE SUMMARY:  Successful US guided left thoracentesis. Yielded 800 mL of amber fluid. Pt tolerated procedure well, however procedure was stopped prior to removal of all fluid due to patient discomfort. No immediate complications.  Specimen was sent for labs. CXR ordered.  EBL < 5 mL  Docia Barrier PA-C 06/17/2018 2:25 PM

## 2018-06-17 NOTE — Evaluation (Signed)
Physical Therapy Evaluation Patient Details Name: Ann Cline MRN: [id number] DOB: 1928-12-02 Today's Date: 06/17/2018   History of Present Illness  85 yo female with onset of PNA and pleural effusion with atelectasis was admitted, planned thoracentesis and cleared for PE.  PMHx:  anemia,   Clinical Impression  Pt was seen for mobility and ck of O2 sats, noted her proficiency with min guard to walk on the hall.  Pt is expecting to go home and has a very supportive family here to monitor her progress.  Will follow acutely and transition to HHPT for gait and vital ck's, and could return to outpatient therapy for further upgrade of her routine for endurance training.  Follow for now for LE ex and balance, endurance work on gait.    Follow Up Recommendations Home health PT;Supervision for mobility/OOB(for ck of O2 sats)    Equipment Recommendations  None recommended by PT    Recommendations for Other Services       Precautions / Restrictions Precautions Precautions: Fall Precaution Comments: monitor O2 sats Restrictions Weight Bearing Restrictions: No      Mobility  Bed Mobility               General bed mobility comments: up in chair when PT arrived  Transfers Overall transfer level: Modified independent Equipment used: None             General transfer comment: uses hands for stability on chair arms  Ambulation/Gait Ambulation/Gait assistance: Min guard Gait Distance (Feet): 160 Feet Assistive device: None;1 person hand held assist(contact for safety) Gait Pattern/deviations: Step-through pattern;Decreased stride length;Narrow base of support Gait velocity: household speeds Gait velocity interpretation: <1.31 ft/sec, indicative of household ambulator General Gait Details: pt is motivated and working to maintain balance with careful Production assistant, radio [place] (Stroke Patients Only)       Balance  Overall balance assessment: No apparent balance deficits (not formally assessed)                                           Pertinent Vitals/Pain Pain Assessment: No/denies pain    Home Living Family/patient expects to be discharged to:: Private residence Living Arrangements: Alone Available Help at Discharge: Family;Available PRN/intermittently Type of Home: House       Home Layout: One level Home Equipment: Cane - single point Additional Comments: pt has been very independent and nursing is permitting her to walk alone in her room    Prior Function Level of Independence: Independent               Hand Dominance   Dominant Hand: Right    Extremity/Trunk Assessment   Upper Extremity Assessment Upper Extremity Assessment: Overall WFL for tasks assessed    Lower Extremity Assessment Lower Extremity Assessment: Overall WFL for tasks assessed    Cervical / Trunk Assessment Cervical / Trunk Assessment: Normal  Communication   Communication: No difficulties  Cognition Arousal/Alertness: Awake/alert Behavior During Therapy: WFL for tasks assessed/performed Overall Cognitive Status: Within Functional Limits for tasks assessed                                        General Comments General  comments (skin integrity, edema, etc.): pt was assessed with pulse oximeter during gait, and noted no drops in sat during mobility beyond 93% and then returned to resting levels     Exercises     Assessment/Plan    PT Assessment Patient needs continued PT services  PT Problem List Decreased strength;Decreased range of motion;Decreased mobility;Decreased knowledge of precautions;Cardiopulmonary status limiting activity       PT Treatment Interventions DME instruction;Gait training;Stair training;Functional mobility training;Therapeutic activities;Therapeutic exercise;Balance training;Neuromuscular re-education;Patient/family education    PT  Goals (Current goals can be found in the Care Plan section)  Acute Rehab PT Goals Patient Stated Goal: to get home and feel better PT Goal Formulation: With patient Time For Goal Achievement: 07/01/18 Potential to Achieve Goals: Good    Frequency Min 3X/week   Barriers to discharge Decreased caregiver support home alone most of the time    Co-evaluation               AM-PAC PT "6 Clicks" Mobility  Outcome Measure Help needed turning from your back to your side while in a flat bed without using bedrails?: None Help needed moving from lying on your back to sitting on the side of a flat bed without using bedrails?: None Help needed moving to and from a bed to a chair (including a wheelchair)?: A Little Help needed standing up from a chair using your arms (e.g., wheelchair or bedside chair)?: A Little Help needed to walk in hospital room?: A Little Help needed climbing 3-5 steps with a railing? : A Lot 6 Click Score: 19    End of Session Equipment Utilized During Treatment: Gait belt Activity Tolerance: Patient tolerated treatment well;Treatment limited secondary to medical complications (Comment) Patient left: in chair;with call bell/phone within reach;with family/visitor present Nurse Communication: Mobility status PT Visit Diagnosis: Muscle weakness (generalized) (M62.81);Other abnormalities of gait and mobility (R26.89)    Time: 1287-8676 PT Time Calculation (min) (ACUTE ONLY): 27 min   Charges:   PT Evaluation $PT Eval Moderate Complexity: 1 Mod PT Treatments $Gait Training: 8-22 mins        Ramond Dial 06/17/2018, 4:48 PM  Mee Hives, PT MS Acute Rehab Dept. Number: [place] and [place]

## 2018-06-17 NOTE — Progress Notes (Signed)
  Echocardiogram 2D Echocardiogram has been performed.  Ann Cline 06/17/2018, 11:51 AM

## 2018-06-18 LAB — PH, BODY FLUID: pH, Body Fluid: 7.6

## 2018-06-18 LAB — CBC
HCT: 35.9 % — ABNORMAL LOW (ref 36.0–46.0)
Hemoglobin: 11.1 g/dL — ABNORMAL LOW (ref 12.0–15.0)
MCH: 22.8 pg — ABNORMAL LOW (ref 26.0–34.0)
MCHC: 30.9 g/dL (ref 30.0–36.0)
MCV: 73.9 fL — ABNORMAL LOW (ref 80.0–100.0)
PLATELETS: 201 10*3/uL (ref 150–400)
RBC: 4.86 MIL/uL (ref 3.87–5.11)
RDW: 13.6 % (ref 11.5–15.5)
WBC: 6.7 10*3/uL (ref 4.0–10.5)
nRBC: 0 % (ref 0.0–0.2)

## 2018-06-18 LAB — BASIC METABOLIC PANEL
Anion gap: 11 (ref 5–15)
BUN: 20 mg/dL (ref 8–23)
CALCIUM: 8.6 mg/dL — AB (ref 8.9–10.3)
CO2: 26 mmol/L (ref 22–32)
Chloride: 100 mmol/L (ref 98–111)
Creatinine, Ser: 1.26 mg/dL — ABNORMAL HIGH (ref 0.44–1.00)
GFR calc Af Amer: 44 mL/min — ABNORMAL LOW (ref >60)
GFR, EST NON AFRICAN AMERICAN: 38 mL/min — AB (ref >60)
Glucose, Bld: 119 mg/dL — ABNORMAL HIGH (ref 70–99)
Potassium: 3.8 mmol/L (ref 3.5–5.1)
Sodium: 137 mmol/L (ref 135–145)

## 2018-06-18 LAB — MAGNESIUM: Magnesium: 2.2 mg/dL (ref 1.7–2.4)

## 2018-06-18 MED ORDER — DOCUSATE SODIUM 100 MG PO CAPS
200.0000 mg | ORAL_CAPSULE | Freq: Two times a day (BID) | ORAL | Status: DC
  Administered 2018-06-18 – 2018-06-19 (×2): 200 mg via ORAL
  Filled 2018-06-18 (×3): qty 2

## 2018-06-18 MED ORDER — BISACODYL 5 MG PO TBEC
10.0000 mg | DELAYED_RELEASE_TABLET | Freq: Once | ORAL | Status: AC
  Administered 2018-06-18: 10 mg via ORAL
  Filled 2018-06-18: qty 2

## 2018-06-18 NOTE — Care Management Note (Addendum)
Case Management Note  Patient Details  Name: Ann Cline MRN: [id number] Date of Birth: [birth year]  Subjective/Objective:      SOB/  Acute hypoxic respiratory failure due to acute on chronic diastolic CHF. From home alone. PTA independent with ADL's , no DME usage.          Marcella Dubs (Sister)/ [place]  [name] (sister)/ [place]   [phone]  [phone]    PCP: Charlott Holler  Action/Plan: Transition to home with home health services when medically stable. Home address: [address], [place], [place], [address].  Expected Discharge Date:                  Expected Discharge Plan:  [place]  In-House Referral:  NA  Discharge planning Services  CM Consult  Post Acute Care Choice:  NA Choice offered to:  Patient  DME Arranged:   rolling walker, will be delivered to bedside prior to discharge DME Agency:   [place]:   RN,PT [place] Agency:   [facility]  Status of Service:  complete  If discussed at [place] of Stay Meetings, dates discussed:    Additional Comments:  Sharin Mons, RN 06/18/2018, 4:14 PM

## 2018-06-18 NOTE — Progress Notes (Signed)
PROGRESS NOTE                                                                                                                                                                                                             Patient Demographics:    Ann Cline, is a 85 y.o. female, DOB - 09/10/1928, QAS:[id number]  Admit date - 06/15/2018   Admitting Physician Karmen Bongo, MD  Outpatient Primary MD for the patient is Patient, No Pcp Per  LOS - 2  Chief Complaint  Patient presents with  . Shortness of Breath  . Cough  . Nausea       Brief Narrative  Ann Cline is an 85 y.o. female withno significant pastmedical historypresenting with SOB.She couldn't do anything else besides come to the ER.  She has been a little SOB and had some discomfort with breathing but then it got worse. She was diagnosed with PNA about 10 days and was given Levaquin x 7 days. She completed the course on Tuesday but the SOB started right after she finished it.    Subjective:   Patient in bed, appears comfortable, denies any headache, no fever, no chest pain or pressure, almost resolved shortness of breath , no abdominal pain. No focal weakness.   Assessment  & Plan :     1.  Acute hypoxic respiratory failure due to acute on chronic diastolic CHF with EF 79%.  Stopped IV fluids and IV antibiotics, placed her on Nitropaste along with high-dose IV Lasix, I have added Coreg along with low-dose hydralazine.  She underwent thoracentesis on 06/17/2018 with 800 cc of exudative fluid removed on the left side.  CT chest was unremarkable.  Her shortness of breath is much improved on 06/18/2018, continue supportive care, advance activity titrate off oxygen if stable discharge likely tomorrow.   2.  Dysphagia.  Speech following currently on dysphagia 3 diet.  3.  Essential hypertension.  Placed on Coreg and hydralazine scheduled along with PRN IV hydralazine.  Also Nitropaste on board.  4.   Steroid-induced hyperglycemia.  Stopped steroids.  Will monitor.  5.  History of osteoporosis.  Resume home medication upon discharge.  6.  Mild chronic microcytic iron deficiency anemia.  Placed on oral iron, outpatient GI follow-up for age-appropriate outpatient work-up.  7.  Exudative effusion on the left also effusion on the right.  Cytology pending outpatient pulmonary follow-up.  Now continue to diurese.    Family Communication  :  None  Code  Status :  Full  Disposition Plan  :  TBD  Consults  : None  Procedures  :    TTE -   1. The left ventricle has normal systolic function of 03-88%. The cavity size is normal. There is normal left ventricular wall thickness. Echo evidence of impaired relaxation diastolic filling patterns. Normal left ventricular filling pressures.  2. Normal left atrial size.  3. Normal right atrial size.  4. Trivial pericardial effusion.  5. Large left pleural effusion.  6. Mass like structure is seen in the left pleural space - consider atelectatic lung versus neoplasm.  7. No atrial level shunt detected by color flow Doppler.   Ultrasound-guided thoracentesis requested on 06/17/2018.  Left side thoracentesis done with 800 cc of exudative fluid removed.  DVT Prophylaxis  :  Lovenox   Lab Results  Component Value Date   PLT 201 06/18/2018    Diet :  Diet Order            DIET DYS 3 Room service appropriate? Yes; Fluid consistency: Thin  Diet effective now               Inpatient Medications Scheduled Meds: . carvedilol  3.125 mg Oral BID WC  . enoxaparin (LOVENOX) injection  40 mg Subcutaneous Q24H  . ferrous sulfate  325 mg Oral BID WC  . hydrALAZINE  25 mg Oral Q8H  . multivitamin with minerals  1 tablet Oral Daily  . nitroGLYCERIN  0.5 inch Topical Q6H   Continuous Infusions:  PRN Meds:.acetaminophen, hydrALAZINE, ipratropium-albuterol, lidocaine (PF), polyvinyl alcohol, traMADol  Antibiotics  :   Anti-infectives (From  admission, onward)   Start     Dose/Rate Route Frequency Ordered Stop   06/16/18 1200  cefTRIAXone (ROCEPHIN) 1 g in sodium chloride 0.9 % 100 mL IVPB  Status:  Discontinued     1 g 200 mL/hr over 30 Minutes Intravenous Every 24 hours 06/15/18 1647 06/17/18 1024   06/16/18 1200  azithromycin (ZITHROMAX) 500 mg in sodium chloride 0.9 % 250 mL IVPB  Status:  Discontinued     500 mg 250 mL/hr over 60 Minutes Intravenous Every 24 hours 06/15/18 1647 06/17/18 1024   06/15/18 1500  cefTRIAXone (ROCEPHIN) 1 g in sodium chloride 0.9 % 100 mL IVPB     1 g 200 mL/hr over 30 Minutes Intravenous  Once 06/15/18 1447 06/15/18 1640   06/15/18 1500  azithromycin (ZITHROMAX) 500 mg in sodium chloride 0.9 % 250 mL IVPB     500 mg 250 mL/hr over 60 Minutes Intravenous  Once 06/15/18 1447 06/15/18 1741          Objective:   Vitals:   06/17/18 0407 06/17/18 1453 06/17/18 2100 06/18/18 0533  BP: (!) 158/93 134/90 124/78 104/72  Pulse: 66 72 85 66  Resp: 20 18 17 20   Temp: 98.3 F (36.8 C) 98.1 F (36.7 C)  98.3 F (36.8 C)  TempSrc: Oral Oral  Oral  SpO2: 98% 98% 93% 94%    Wt Readings from Last 3 Encounters:  No data found for Wt    No intake or output data in the 24 hours ending 06/18/18 1057   Physical Exam  Awake Alert, Oriented X 3, No new F.N deficits, Normal affect [place].AT,PERRAL Supple Neck,No JVD, No cervical lymphadenopathy appriciated.  Symmetrical Chest wall movement, Good air movement bilaterally, few rales RRR,No Gallops, Rubs or new Murmurs, No Parasternal Heave +ve B.Sounds, Abd Soft, No tenderness, No organomegaly appriciated, No rebound -  guarding or rigidity. No Cyanosis, Clubbing or edema, No new Rash or bruise    Data Review:    CBC Recent Labs  Lab 06/15/18 1245 06/16/18 0239 06/17/18 0519 06/18/18 0432  WBC 5.2 3.8* 6.4 6.7  HGB 11.9* 10.6* 11.0* 11.1*  HCT 39.2 34.8* 35.5* 35.9*  PLT 188 177 172 201  MCV 75.1* 74.4* 74.9* 73.9*  MCH 22.8* 22.6* 23.2*  22.8*  MCHC 30.4 30.5 31.0 30.9  RDW 13.7 13.9 13.9 13.6  LYMPHSABS 1.2 0.7  --   --   MONOABS 0.6 0.2  --   --   EOSABS 0.1 0.0  --   --   BASOSABS 0.0 0.0  --   --     Chemistries  Recent Labs  Lab 06/15/18 1245 06/16/18 0239 06/17/18 0519 06/18/18 0432  NA 138 139 140 137  K 4.5 4.5 4.0 3.8  CL 105 105 108 100  CO2 24 22 23 26   GLUCOSE 113* 131* 107* 119*  BUN 13 13 15 20   CREATININE 1.00 0.93 0.98 1.26*  CALCIUM 8.7* 8.2* 8.2* 8.6*  MG  --   --   --  2.2  AST 21  --  19  --   ALT 16  --  14  --   ALKPHOS 101  --  88  --   BILITOT 0.7  --  0.7  --    ------------------------------------------------------------------------------------------------------------------ No results for input(s): CHOL, HDL, LDLCALC, TRIG, CHOLHDL, LDLDIRECT in the last 72 hours.  Lab Results  Component Value Date   HGBA1C 5.9 (H) 06/17/2018   ------------------------------------------------------------------------------------------------------------------ No results for input(s): TSH, T4TOTAL, T3FREE, THYROIDAB in the last 72 hours.  Invalid input(s): FREET3 ------------------------------------------------------------------------------------------------------------------ Recent Labs    06/16/18 1006  FERRITIN 232  TIBC 228*  IRON 24*    Coagulation profile No results for input(s): INR, PROTIME in the last 168 hours.  No results for input(s): DDIMER in the last 72 hours.  Cardiac Enzymes No results for input(s): CKMB, TROPONINI, MYOGLOBIN in the last 168 hours.  Invalid input(s): CK ------------------------------------------------------------------------------------------------------------------    Component Value Date/Time   BNP 18.0 06/15/2018 1245    Micro Results Recent Results (from the past 240 hour(s))  Blood culture (routine x 2)     Status: None (Preliminary result)   Collection Time: 06/15/18  2:47 PM  Result Value Ref Range Status   Specimen Description BLOOD  RIGHT ANTECUBITAL  Final   Special Requests   Final    BOTTLES DRAWN AEROBIC AND ANAEROBIC Blood Culture adequate volume   Culture   Final    NO GROWTH 3 DAYS Performed at [facility], [address] [place], [place] [address]    Report Status PENDING  Incomplete  Blood culture (routine x 2)     Status: None (Preliminary result)   Collection Time: 06/15/18  4:08 PM  Result Value Ref Range Status   Specimen Description BLOOD RIGHT ANTECUBITAL  Final   Special Requests   Final    BOTTLES DRAWN AEROBIC AND ANAEROBIC Blood Culture adequate volume   Culture   Final    NO GROWTH 3 DAYS Performed at [facility], [place] [address], [place], [place] [address]    Report Status PENDING  Incomplete  Respiratory Panel by PCR     Status: None   Collection Time: 06/15/18  5:45 PM  Result Value Ref Range Status   Adenovirus NOT DETECTED NOT DETECTED Final   Coronavirus 229E NOT DETECTED NOT  DETECTED Final   Coronavirus HKU1 NOT DETECTED NOT DETECTED Final   Coronavirus NL63 NOT DETECTED NOT DETECTED Final   Coronavirus OC43 NOT DETECTED NOT DETECTED Final   Metapneumovirus NOT DETECTED NOT DETECTED Final   Rhinovirus / Enterovirus NOT DETECTED NOT DETECTED Final   Influenza A NOT DETECTED NOT DETECTED Final   Influenza B NOT DETECTED NOT DETECTED Final   Parainfluenza Virus 1 NOT DETECTED NOT DETECTED Final   Parainfluenza Virus 2 NOT DETECTED NOT DETECTED Final   Parainfluenza Virus 3 NOT DETECTED NOT DETECTED Final   Parainfluenza Virus 4 NOT DETECTED NOT DETECTED Final   Respiratory Syncytial Virus NOT DETECTED NOT DETECTED Final   Bordetella pertussis NOT DETECTED NOT DETECTED Final   Chlamydophila pneumoniae NOT DETECTED NOT DETECTED Final   Mycoplasma pneumoniae NOT DETECTED NOT DETECTED Final    Comment: Performed at [facility], [place] [address], [place], [place] [address]  Culture, sputum-assessment     Status: None   Collection Time: 06/15/18  8:21 PM  Result  Value Ref Range Status   Specimen Description SPUTUM  Final   Special Requests NONE  Final   Sputum evaluation   Final    Sputum specimen not acceptable for testing.  Please recollect.   RESULT CALLED TO, READ BACK BY AND VERIFIED WITH: RN Hulda Marin [phone]    Report Status 06/16/2018 FINAL  Final  Expectorated sputum assessment w rflx to resp cult     Status: None   Collection Time: 06/16/18  8:07 AM  Result Value Ref Range Status   Specimen Description EXPECTORATED SPUTUM  Final   Special Requests NONE  Final   Sputum evaluation   Final    THIS SPECIMEN IS ACCEPTABLE FOR SPUTUM CULTURE Performed at [facility], [place] [address] [place], [place] [address]    Report Status 06/16/2018 FINAL  Final  Culture, respiratory     Status: None (Preliminary result)   Collection Time: 06/16/18  8:07 AM  Result Value Ref Range Status   Specimen Description EXPECTORATED SPUTUM  Final   Special Requests   Final    NONE Reflexed from [id number] Performed at [facility], [place] [address], [place], [place] [address]    Gram Stain   Final    FEW SQUAMOUS EPITHELIAL CELLS PRESENT FEW WBC PRESENT, PREDOMINANTLY PMN FEW GRAM POSITIVE COCCI RARE GRAM NEGATIVE RODS RARE GRAM POSITIVE RODS    Culture FEW Consistent with normal respiratory flora.  Final   Report Status PENDING  Incomplete  Gram stain     Status: None   Collection Time: 06/17/18  2:02 PM  Result Value Ref Range Status   Specimen Description PLEURAL LEFT  Final   Special Requests NONE  Final   Gram Stain   Final    FEW WBC PRESENT,BOTH PMN AND MONONUCLEAR NO ORGANISMS SEEN Performed at [facility], [address] [place] [address], [place], [place] [address]    Report Status 06/17/2018 FINAL  Final  Culture, body fluid-bottle     Status: None (Preliminary result)   Collection Time: 06/17/18  2:02 PM  Result Value Ref Range Status   Specimen Description PLEURAL LEFT  Final   Special Requests NONE  Final   Culture   Final     NO GROWTH < 24 HOURS Performed at [facility], [place] [address] [place], [place] [address]    Report Status PENDING  Incomplete    Radiology Reports Dg Chest 1  View  Result Date: 06/17/2018 CLINICAL DATA:  Left thoracentesis EXAM: CHEST  1 VIEW COMPARISON:  June 15, 2018 FINDINGS: There is a moderate to large right-sided pleural effusion, more prominent the interval. The left-sided pleural effusion persists but may be smaller. No pneumothorax. No change in the cardiomediastinal silhouette. IMPRESSION: 1. Moderate to large right pleural effusion, more prominent the interval. 2. The left pleural effusion is smaller in the interval consistent with thoracentesis. No pneumothorax. 3. No other changes. Electronically Signed   By: Dorise Bullion III M.D   On: 06/17/2018 14:33   Dg Chest 2 View  Result Date: 06/15/2018 CLINICAL DATA:  Short of breath for 3 days. Productive cough for a week. EXAM: CHEST - 2 VIEW COMPARISON:  06/05/2018 FINDINGS: Moderate bilateral pleural effusions, increased in size from prior study. There is lung base opacity med is likely atelectasis. Can not exclude pneumonia. Upper lungs are clear.  No evidence of pulmonary edema. Cardiac silhouette is partly obscured. It is grossly normal in size. No pneumothorax. IMPRESSION: 1. Increased size of the bilateral pleural effusions since the prior study. There is associated lung base opacity consistent with atelectasis, pneumonia or a combination. 2. No evidence of pulmonary edema. Electronically Signed   By: Lajean Manes M.D.   On: 06/15/2018 13:36   Dg Chest 2 View  Result Date: 06/05/2018 CLINICAL DATA:  85 year old female with a history of pleural effusion on prior ultrasound abdomen EXAM: CHEST - 2 VIEW COMPARISON:  None. FINDINGS: Cardiomediastinal silhouette within normal limits. Opacities at the bilateral lung bases with obscuration of the hemidiaphragm, blunting of the costophrenic angles and blunting of the  costophrenic sulcus. Patchy airspace opacities bilateral lung bases. No pneumothorax. Thickening of the fissures. IMPRESSION: Bilateral pleural effusions with associated patchy opacities, potentially representing atelectasis, scarring, or consolidation/infection. Electronically Signed   By: Corrie Mckusick D.O.   On: 06/05/2018 13:00   Ct Angio Chest Pe W Or Wo Contrast  Result Date: 06/16/2018 CLINICAL DATA:  Shortness of breath. EXAM: CT ANGIOGRAPHY CHEST WITH CONTRAST TECHNIQUE: Multidetector CT imaging of the chest was performed using the standard protocol during bolus administration of intravenous contrast. Multiplanar CT image reconstructions and MIPs were obtained to evaluate the vascular anatomy. CONTRAST:  133mL ISOVUE-370 IOPAMIDOL (ISOVUE-370) INJECTION 76% COMPARISON:  Chest radiograph, 06/15/2018 FINDINGS: Cardiovascular: There is satisfactory opacification of the pulmonary arteries to the segmental level. There is no evidence of a pulmonary embolism. Heart is normal in size and configuration. No pericardial effusion. No coronary artery calcifications. Great vessels are normal in caliber. No aortic dissection or atherosclerosis. Mediastinum/Nodes: No mediastinal or hilar masses. No enlarged lymph nodes. Trachea and esophagus are unremarkable. There is increased attenuation in the mediastinal and hilar fat consistent with edema. Lungs/Pleura: Large bilateral pleural effusions. Complete atelectasis of both lower lobes. Mild dependent atelectasis noted in the upper lobes and right middle lobe. Minor areas of ground-glass opacity in the superior aspect of the upper lobes, also likely atelectasis. No convincing pneumonia or pulmonary edema. No lung mass or suspicious nodule. No pneumothorax. Upper Abdomen: No acute abnormality. Musculoskeletal: No fracture or acute finding. No osteoblastic or osteolytic lesions. Review of the MIP images confirms the above findings. IMPRESSION: 1. No evidence of a pulmonary  embolism. 2. Large bilateral pleural effusions with complete lower lobe atelectasis bilaterally. Mild dependent atelectasis in the upper lobes and right middle lobe. 3. No convincing pneumonia or pulmonary edema. Electronically Signed   By: Lajean Manes M.D.   On: 06/16/2018 11:42  Mm Digital Screening Bilateral  Result Date: 05/23/2018 CLINICAL DATA:  Screening. EXAM: DIGITAL SCREENING BILATERAL MAMMOGRAM WITH CAD COMPARISON:  Previous exam(s). ACR Breast Density Category c: The breast tissue is heterogeneously dense, which may obscure small masses. FINDINGS: There are no findings suspicious for malignancy. Images were processed with CAD. IMPRESSION: No mammographic evidence of malignancy. A result letter of this screening mammogram will be mailed directly to the patient. RECOMMENDATION: Screening mammogram in one year. (Code:SM-B-01Y) BI-RADS CATEGORY  1: Negative. Electronically Signed   By: Lovey Newcomer M.D.   On: 05/23/2018 16:00   Ir Thoracentesis Asp Pleural Space W/img Guide  Result Date: 06/17/2018 INDICATION: Patient with bilateral pleural effusions. Request is made for diagnostic and therapeutic left thoracentesis. EXAM: ULTRASOUND GUIDED DIAGNOSTIC AND THERAPEUTIC LEFT THORACENTESIS MEDICATIONS: 10 mL 1% lidocaine COMPLICATIONS: None immediate. PROCEDURE: An ultrasound guided thoracentesis was thoroughly discussed with the patient and questions answered. The benefits, risks, alternatives and complications were also discussed. The patient understands and wishes to proceed with the procedure. Written consent was obtained. Ultrasound was performed to localize and mark an adequate pocket of fluid in the left chest. The area was then prepped and draped in the normal sterile fashion. 1% Lidocaine was used for local anesthesia. Under ultrasound guidance a Safe-T-Centesis catheter was introduced. Thoracentesis was performed. The catheter was removed and a dressing applied. FINDINGS: A total of  approximately 800 mL of amber fluid was removed. Samples were sent to the laboratory as requested by the clinical team. IMPRESSION: Successful ultrasound guided diagnostic and therapeutic left thoracentesis yielding 800 mL of pleural fluid. Read by: Brynda Greathouse PA-C Electronically Signed   By: Sandi Mariscal M.D.   On: 06/17/2018 14:41    Time Spent in minutes  30   Lala Lund M.D on 06/18/2018 at 10:57 AM  To page go to [email or web address] - password [email or web address]

## 2018-06-18 NOTE — Progress Notes (Signed)
  Speech Language Pathology Treatment: Dysphagia  Patient Details Name: Ann Cline MRN: [id number] DOB: [birth year] Today's Date: 06/18/2018 Time: 5953-9672 SLP Time Calculation (min) (ACUTE ONLY): 14 min  Assessment / Plan / Recommendation Clinical Impression  SLP provided skilled observation during breakfast meal. Pt had one immediate cough following a sip of thin liquids, taken while her mouth was full of food. Pt described feeling like "the food went one way and the liquid went the other." SLP provided education about alternating solids/liquids during a meal to facilitate clearance, but that she should use additional precaution to make sure that her oral cavity is clear of food before drinking. With Min cues to do so, she had no further overt signs of aspiration. She did have intermittent eructation, suspicious for a more esophageal component. Recommend continuing current diet and precautions as outlined below. Will continue to follow acutely.   HPI HPI: Ann Cline is a 85 y.o. female with no significant past medical history presenting with SOB. She was diagnosed with PNA about 10 days and was given Levaquin x 7 days. CXR with pleural effusion worse since recent xray with possible PNA. Swallow evaluation ordered d/t pt complaining of difficulty swallowing liquids.       SLP Plan  Continue with current plan of care       Recommendations  Diet recommendations: Dysphagia 3 (mechanical soft);Thin liquid Liquids provided via: Cup;Straw Medication Administration: Whole meds with liquid Supervision: Patient able to self feed;Intermittent supervision to cue for compensatory strategies Compensations: Slow rate;Small sips/bites(take rest breaks) Postural Changes and/or Swallow Maneuvers: Seated upright 90 degrees;Upright 30-60 min after meal                Oral Care Recommendations: Oral care BID Follow up Recommendations: Other (comment)(tba) SLP Visit Diagnosis: Dysphagia,  unspecified (R13.10) Plan: Continue with current plan of care       Ann Cline 06/18/2018, 9:50 AM  Ann Cline Ann Cline, M.A. [place] Acute Environmental education officer [phone] Office [phone]

## 2018-06-19 ENCOUNTER — Inpatient Hospital Stay (HOSPITAL_COMMUNITY): Payer: Medicare HMO

## 2018-06-19 LAB — BASIC METABOLIC PANEL
Anion gap: 13 (ref 5–15)
BUN: 19 mg/dL (ref 8–23)
CO2: 24 mmol/L (ref 22–32)
Calcium: 8.7 mg/dL — ABNORMAL LOW (ref 8.9–10.3)
Chloride: 97 mmol/L — ABNORMAL LOW (ref 98–111)
Creatinine, Ser: 1.07 mg/dL — ABNORMAL HIGH (ref 0.44–1.00)
GFR calc Af Amer: 54 mL/min — ABNORMAL LOW (ref >60)
GFR calc non Af Amer: 47 mL/min — ABNORMAL LOW (ref >60)
Glucose, Bld: 112 mg/dL — ABNORMAL HIGH (ref 70–99)
Potassium: 3.6 mmol/L (ref 3.5–5.1)
SODIUM: 134 mmol/L — AB (ref 135–145)

## 2018-06-19 LAB — CULTURE, RESPIRATORY

## 2018-06-19 LAB — CULTURE, RESPIRATORY W GRAM STAIN: Culture: NORMAL

## 2018-06-19 MED ORDER — FUROSEMIDE 40 MG PO TABS
40.0000 mg | ORAL_TABLET | Freq: Once | ORAL | Status: AC
  Administered 2018-06-19: 40 mg via ORAL
  Filled 2018-06-19: qty 1

## 2018-06-19 MED ORDER — ONDANSETRON HCL 4 MG/2ML IJ SOLN
4.0000 mg | Freq: Four times a day (QID) | INTRAMUSCULAR | Status: DC | PRN
  Administered 2018-06-20: 4 mg via INTRAVENOUS
  Filled 2018-06-19 (×2): qty 2

## 2018-06-19 MED ORDER — POTASSIUM CHLORIDE CRYS ER 20 MEQ PO TBCR
20.0000 meq | EXTENDED_RELEASE_TABLET | Freq: Once | ORAL | Status: AC
  Administered 2018-06-19: 20 meq via ORAL
  Filled 2018-06-19: qty 1

## 2018-06-19 NOTE — Progress Notes (Signed)
Physical Therapy Treatment Patient Details Name: Ann Cline MRN: [id number] DOB: [birth year] Today's Date: 06/19/2018    History of Present Illness Pt is an 85 y/o female with onset of PNA and pleural effusion with atelectasis was admitted, planned thoracentesis and cleared for PE. No pertinent PMH.    PT Comments    Pt more limited this session secondary to nausea; however, remains steady with functional mobility with use of RW. Pt would continue to benefit from skilled physical therapy services at this time while admitted and after d/c to address the below listed limitations in order to improve overall safety and independence with functional mobility.   Follow Up Recommendations  Home health PT     Equipment Recommendations  Rolling walker with 5" wheels    Recommendations for Other Services       Precautions / Restrictions Precautions Precautions: Fall Restrictions Weight Bearing Restrictions: No    Mobility  Bed Mobility Overal bed mobility: Modified Independent                Transfers Overall transfer level: Needs assistance Equipment used: None Transfers: Sit to/from Stand Sit to Stand: Supervision         General transfer comment: supervision for safety  Ambulation/Gait Ambulation/Gait assistance: Min guard Gait Distance (Feet): 75 Feet Assistive device: Rolling walker (2 wheeled) Gait Pattern/deviations: Step-through pattern;Decreased stride length;Narrow base of support Gait velocity: decreased   General Gait Details: pt with mild instability but no overt LOB or need for physical assistance, min guard for safety, limited secondary to nausea   Stairs             Wheelchair Mobility    Modified Rankin (Stroke Patients Only)       Balance Overall balance assessment: Needs assistance Sitting-balance support: Feet supported Sitting balance-Leahy Scale: Good     Standing balance support: During functional activity;No upper  extremity supported Standing balance-Leahy Scale: Fair                              Cognition Arousal/Alertness: Awake/alert Behavior During Therapy: WFL for tasks assessed/performed Overall Cognitive Status: Within Functional Limits for tasks assessed                                        Exercises      General Comments        Pertinent Vitals/Pain Pain Assessment: No/denies pain    Home Living                      Prior Function            PT Goals (current goals can now be found in the care plan section) Acute Rehab PT Goals PT Goal Formulation: With patient Time For Goal Achievement: 07/01/18 Potential to Achieve Goals: Good Progress towards PT goals: Progressing toward goals    Frequency    Min 3X/week      PT Plan Current plan remains appropriate    Co-evaluation              AM-PAC PT "6 Clicks" Mobility   Outcome Measure  Help needed turning from your back to your side while in a flat bed without using bedrails?: None Help needed moving from lying on your back to sitting on the side of a flat bed without  using bedrails?: None Help needed moving to and from a bed to a chair (including a wheelchair)?: A Little Help needed standing up from a chair using your arms (e.g., wheelchair or bedside chair)?: None Help needed to walk in hospital room?: A Little Help needed climbing 3-5 steps with a railing? : A Little 6 Click Score: 21    End of Session Equipment Utilized During Treatment: Gait belt Activity Tolerance: Other (comment)(limited secondary to nausea) Patient left: in chair;with call bell/phone within reach Nurse Communication: Mobility status PT Visit Diagnosis: Muscle weakness (generalized) (M62.81);Other abnormalities of gait and mobility (R26.89)     Time: 2897-9150 PT Time Calculation (min) (ACUTE ONLY): 14 min  Charges:  $Gait Training: 8-22 mins                     Sherie Don, [place],  DPT  Acute Rehabilitation Services Pager [phone] Office [place] 06/19/2018, 1:14 PM

## 2018-06-19 NOTE — Progress Notes (Signed)
PROGRESS NOTE                                                                                                                                                                                                             Patient Demographics:    Ann Cline, is a 85 y.o. female, DOB - 1929-04-13, AUQ:[id number]  Admit date - 06/15/2018   Admitting Physician Karmen Bongo, MD  Outpatient Primary MD for the patient is Chauncy Passy Rudi Rummage, NP  LOS - 3  Chief Complaint  Patient presents with  . Shortness of Breath  . Cough  . Nausea       Brief Narrative  Ann Cline is an 85 y.o. female withno significant pastmedical historypresenting with SOB.She couldn't do anything else besides come to the ER.  She has been a little SOB and had some discomfort with breathing but then it got worse. She was diagnosed with PNA about 10 days and was given Levaquin x 7 days. She completed the course on Tuesday but the SOB started right after she finished it.    Subjective:   Patient in bed, appears comfortable, denies any headache, no fever, no chest pain or pressure, minimal to no shortness of breath , no abdominal pain. No focal weakness.    Assessment  & Plan :     1.  Acute hypoxic respiratory failure due to acute on chronic diastolic CHF with EF 63%.  Stopped IV fluids and IV antibiotics, placed her on Nitropaste along with high-dose IV Lasix, I have added Coreg along with low-dose hydralazine.  She underwent thoracentesis on 06/17/2018 with 800 cc of exudative fluid removed on the left side.  CT chest was unremarkable.  Her shortness of breath is much improved on 06/18/2018, new gentle diuresis, advance activity, titrate off oxygen, if better will discharge tomorrow with outpatient pulmonary and cardiology follow-up.  2.  Dysphagia.  Speech following currently on dysphagia 3 diet.  3.  Essential hypertension.  Placed on Coreg and hydralazine scheduled along with PRN IV hydralazine.   Also Nitropaste on board.  4.  Steroid-induced hyperglycemia.  Stopped steroids.  Will monitor.  5.  History of osteoporosis.  Resume home medication upon discharge.  6.  Mild chronic microcytic iron deficiency anemia.  Placed on oral iron, outpatient GI follow-up for age-appropriate outpatient work-up.  7.  Exudative effusion on the left also effusion on the right.  Cytology pending outpatient pulmonary follow-up.  Now continue to diurese.    Family  Communication  :  None  Code Status :  Full  Disposition Plan  :  TBD  Consults  : None  Procedures  :    TTE -   1. The left ventricle has normal systolic function of 94-70%. The cavity size is normal. There is normal left ventricular wall thickness. Echo evidence of impaired relaxation diastolic filling patterns. Normal left ventricular filling pressures.  2. Normal left atrial size.  3. Normal right atrial size.  4. Trivial pericardial effusion.  5. Large left pleural effusion.  6. Mass like structure is seen in the left pleural space - consider atelectatic lung versus neoplasm.  7. No atrial level shunt detected by color flow Doppler.   Ultrasound-guided thoracentesis requested on 06/17/2018.  Left side thoracentesis done with 800 cc of exudative fluid removed.  DVT Prophylaxis  :  Lovenox   Lab Results  Component Value Date   PLT 201 06/18/2018    Diet :  Diet Order            DIET DYS 3 Room service appropriate? Yes; Fluid consistency: Thin  Diet effective now               Inpatient Medications Scheduled Meds: . carvedilol  3.125 mg Oral BID WC  . docusate sodium  200 mg Oral BID  . enoxaparin (LOVENOX) injection  40 mg Subcutaneous Q24H  . ferrous sulfate  325 mg Oral BID WC  . furosemide  40 mg Oral Once  . multivitamin with minerals  1 tablet Oral Daily  . potassium chloride  20 mEq Oral Once   Continuous Infusions:  PRN Meds:.acetaminophen, hydrALAZINE, ipratropium-albuterol, lidocaine (PF),  ondansetron (ZOFRAN) IV, polyvinyl alcohol, traMADol  Antibiotics  :   Anti-infectives (From admission, onward)   Start     Dose/Rate Route Frequency Ordered Stop   06/16/18 1200  cefTRIAXone (ROCEPHIN) 1 g in sodium chloride 0.9 % 100 mL IVPB  Status:  Discontinued     1 g 200 mL/hr over 30 Minutes Intravenous Every 24 hours 06/15/18 1647 06/17/18 1024   06/16/18 1200  azithromycin (ZITHROMAX) 500 mg in sodium chloride 0.9 % 250 mL IVPB  Status:  Discontinued     500 mg 250 mL/hr over 60 Minutes Intravenous Every 24 hours 06/15/18 1647 06/17/18 1024   06/15/18 1500  cefTRIAXone (ROCEPHIN) 1 g in sodium chloride 0.9 % 100 mL IVPB     1 g 200 mL/hr over 30 Minutes Intravenous  Once 06/15/18 1447 06/15/18 1640   06/15/18 1500  azithromycin (ZITHROMAX) 500 mg in sodium chloride 0.9 % 250 mL IVPB     500 mg 250 mL/hr over 60 Minutes Intravenous  Once 06/15/18 1447 06/15/18 1741          Objective:   Vitals:   06/18/18 0533 06/18/18 1540 06/18/18 2233 06/19/18 0553  BP: 104/72 (!) 99/58 117/68 121/73  Pulse: 66 75 80 73  Resp: 20 14 16 17   Temp: 98.3 F (36.8 C) 98.4 F (36.9 C) 99 F (37.2 C) 98.7 F (37.1 C)  TempSrc: Oral Oral Oral Oral  SpO2: 94% 92% 98% 93%    Wt Readings from Last 3 Encounters:  No data found for Wt    No intake or output data in the 24 hours ending 06/19/18 1137   Physical Exam  Awake Alert, Oriented X 3, No new F.N deficits, Normal affect [place].AT,PERRAL Supple Neck,No JVD, No cervical lymphadenopathy appriciated.  Symmetrical Chest wall movement, Good air movement bilaterally,  few rales RRR,No Gallops, Rubs or new Murmurs, No Parasternal Heave +ve B.Sounds, Abd Soft, No tenderness, No organomegaly appriciated, No rebound - guarding or rigidity. No Cyanosis, Clubbing or edema, No new Rash or bruise    Data Review:    CBC Recent Labs  Lab 06/15/18 1245 06/16/18 0239 06/17/18 0519 06/18/18 0432  WBC 5.2 3.8* 6.4 6.7  HGB 11.9* 10.6*  11.0* 11.1*  HCT 39.2 34.8* 35.5* 35.9*  PLT 188 177 172 201  MCV 75.1* 74.4* 74.9* 73.9*  MCH 22.8* 22.6* 23.2* 22.8*  MCHC 30.4 30.5 31.0 30.9  RDW 13.7 13.9 13.9 13.6  LYMPHSABS 1.2 0.7  --   --   MONOABS 0.6 0.2  --   --   EOSABS 0.1 0.0  --   --   BASOSABS 0.0 0.0  --   --     Chemistries  Recent Labs  Lab 06/15/18 1245 06/16/18 0239 06/17/18 0519 06/18/18 0432 06/19/18 0353  NA 138 139 140 137 134*  K 4.5 4.5 4.0 3.8 3.6  CL 105 105 108 100 97*  CO2 24 22 23 26 24   GLUCOSE 113* 131* 107* 119* 112*  BUN 13 13 15 20 19   CREATININE 1.00 0.93 0.98 1.26* 1.07*  CALCIUM 8.7* 8.2* 8.2* 8.6* 8.7*  MG  --   --   --  2.2  --   AST 21  --  19  --   --   ALT 16  --  14  --   --   ALKPHOS 101  --  88  --   --   BILITOT 0.7  --  0.7  --   --    ------------------------------------------------------------------------------------------------------------------ No results for input(s): CHOL, HDL, LDLCALC, TRIG, CHOLHDL, LDLDIRECT in the last 72 hours.  Lab Results  Component Value Date   HGBA1C 5.9 (H) 06/17/2018   ------------------------------------------------------------------------------------------------------------------ No results for input(s): TSH, T4TOTAL, T3FREE, THYROIDAB in the last 72 hours.  Invalid input(s): FREET3 ------------------------------------------------------------------------------------------------------------------ No results for input(s): VITAMINB12, FOLATE, FERRITIN, TIBC, IRON, RETICCTPCT in the last 72 hours.  Coagulation profile No results for input(s): INR, PROTIME in the last 168 hours.  No results for input(s): DDIMER in the last 72 hours.  Cardiac Enzymes No results for input(s): CKMB, TROPONINI, MYOGLOBIN in the last 168 hours.  Invalid input(s): CK ------------------------------------------------------------------------------------------------------------------    Component Value Date/Time   BNP 18.0 06/15/2018 1245    Micro  Results Recent Results (from the past 240 hour(s))  Blood culture (routine x 2)     Status: None (Preliminary result)   Collection Time: 06/15/18  2:47 PM  Result Value Ref Range Status   Specimen Description BLOOD RIGHT ANTECUBITAL  Final   Special Requests   Final    BOTTLES DRAWN AEROBIC AND ANAEROBIC Blood Culture adequate volume   Culture   Final    NO GROWTH 3 DAYS Performed at [facility], [address], [place], [place] [address]    Report Status PENDING  Incomplete  Blood culture (routine x 2)     Status: None (Preliminary result)   Collection Time: 06/15/18  4:08 PM  Result Value Ref Range Status   Specimen Description BLOOD RIGHT ANTECUBITAL  Final   Special Requests   Final    BOTTLES DRAWN AEROBIC AND ANAEROBIC Blood Culture adequate volume   Culture   Final    NO GROWTH 3 DAYS Performed at [facility], [place] [address] [place], [place] [address]  Report Status PENDING  Incomplete  Respiratory Panel by PCR     Status: None   Collection Time: 06/15/18  5:45 PM  Result Value Ref Range Status   Adenovirus NOT DETECTED NOT DETECTED Final   Coronavirus 229E NOT DETECTED NOT DETECTED Final   Coronavirus HKU1 NOT DETECTED NOT DETECTED Final   Coronavirus NL63 NOT DETECTED NOT DETECTED Final   Coronavirus OC43 NOT DETECTED NOT DETECTED Final   Metapneumovirus NOT DETECTED NOT DETECTED Final   Rhinovirus / Enterovirus NOT DETECTED NOT DETECTED Final   Influenza A NOT DETECTED NOT DETECTED Final   Influenza B NOT DETECTED NOT DETECTED Final   Parainfluenza Virus 1 NOT DETECTED NOT DETECTED Final   Parainfluenza Virus 2 NOT DETECTED NOT DETECTED Final   Parainfluenza Virus 3 NOT DETECTED NOT DETECTED Final   Parainfluenza Virus 4 NOT DETECTED NOT DETECTED Final   Respiratory Syncytial Virus NOT DETECTED NOT DETECTED Final   Bordetella pertussis NOT DETECTED NOT DETECTED Final   Chlamydophila pneumoniae NOT DETECTED NOT DETECTED Final   Mycoplasma  pneumoniae NOT DETECTED NOT DETECTED Final    Comment: Performed at [facility], [place] [address] [place], [place] [address]  Culture, sputum-assessment     Status: None   Collection Time: 06/15/18  8:21 PM  Result Value Ref Range Status   Specimen Description SPUTUM  Final   Special Requests NONE  Final   Sputum evaluation   Final    Sputum specimen not acceptable for testing.  Please recollect.   RESULT CALLED TO, READ BACK BY AND VERIFIED WITH: RN Hulda Marin [id number] [phone]    Report Status 06/16/2018 FINAL  Final  Expectorated sputum assessment w rflx to resp cult     Status: None   Collection Time: 06/16/18  8:07 AM  Result Value Ref Range Status   Specimen Description EXPECTORATED SPUTUM  Final   Special Requests NONE  Final   Sputum evaluation   Final    THIS SPECIMEN IS ACCEPTABLE FOR SPUTUM CULTURE Performed at [facility], [place] [address] [place], [place] [address]    Report Status 06/16/2018 FINAL  Final  Culture, respiratory     Status: None   Collection Time: 06/16/18  8:07 AM  Result Value Ref Range Status   Specimen Description EXPECTORATED SPUTUM  Final   Special Requests   Final    NONE Reflexed from [id number] Performed at [facility], [place] [address], [place], [place] [address]    Gram Stain   Final    FEW SQUAMOUS EPITHELIAL CELLS PRESENT FEW WBC PRESENT, PREDOMINANTLY PMN FEW GRAM POSITIVE COCCI RARE GRAM NEGATIVE RODS RARE GRAM POSITIVE RODS    Culture FEW Consistent with normal respiratory flora.  Final   Report Status 06/19/2018 FINAL  Final  Gram stain     Status: None   Collection Time: 06/17/18  2:02 PM  Result Value Ref Range Status   Specimen Description PLEURAL LEFT  Final   Special Requests NONE  Final   Gram Stain   Final    FEW WBC PRESENT,BOTH PMN AND MONONUCLEAR NO ORGANISMS SEEN Performed at [facility], [address], [place], [place] [address]    Report Status 06/17/2018 FINAL  Final  Culture, body  fluid-bottle     Status: None (Preliminary result)   Collection Time: 06/17/18  2:02 PM  Result Value Ref Range Status   Specimen Description PLEURAL LEFT  Final   Special Requests NONE  Final   Culture   Final    NO GROWTH < 24 HOURS Performed at [facility], [place] [address], [place], [place] [address]    Report Status PENDING  Incomplete    Radiology Reports Dg Chest 1 View  Result Date: 06/17/2018 CLINICAL DATA:  Left thoracentesis EXAM: CHEST  1 VIEW COMPARISON:  June 15, 2018 FINDINGS: There is a moderate to large right-sided pleural effusion, more prominent the interval. The left-sided pleural effusion persists but may be smaller. No pneumothorax. No change in the cardiomediastinal silhouette. IMPRESSION: 1. Moderate to large right pleural effusion, more prominent the interval. 2. The left pleural effusion is smaller in the interval consistent with thoracentesis. No pneumothorax. 3. No other changes. Electronically Signed   By: Dorise Bullion III M.D   On: 06/17/2018 14:33   Dg Chest 2 View  Result Date: 06/19/2018 CLINICAL DATA:  Shortness of breath, respiratory failure EXAM: CHEST - 2 VIEW COMPARISON:  06/17/2018 FINDINGS: Cardiac shadow is enlarged but stable. Bilateral pleural effusions and basilar atelectasis/infiltrate is again seen although slightly improved. This may be in part due to patient upright positioning. No pneumothorax is noted. No acute bony abnormality is seen. IMPRESSION: Bibasilar atelectasis/infiltrate with associated effusions right greater than left. The overall appearance is improved from the prior exam although this may be in part positional in nature. Electronically Signed   By: Inez Catalina M.D.   On: 06/19/2018 08:33   Dg Chest 2 View  Result Date: 06/15/2018 CLINICAL DATA:  Short of breath for 3 days. Productive cough for a week. EXAM: CHEST - 2 VIEW COMPARISON:  06/05/2018 FINDINGS: Moderate bilateral pleural effusions, increased in size from prior  study. There is lung base opacity med is likely atelectasis. Can not exclude pneumonia. Upper lungs are clear.  No evidence of pulmonary edema. Cardiac silhouette is partly obscured. It is grossly normal in size. No pneumothorax. IMPRESSION: 1. Increased size of the bilateral pleural effusions since the prior study. There is associated lung base opacity consistent with atelectasis, pneumonia or a combination. 2. No evidence of pulmonary edema. Electronically Signed   By: Lajean Manes M.D.   On: 06/15/2018 13:36   Dg Chest 2 View  Result Date: 06/05/2018 CLINICAL DATA:  85 year old female with a history of pleural effusion on prior ultrasound abdomen EXAM: CHEST - 2 VIEW COMPARISON:  None. FINDINGS: Cardiomediastinal silhouette within normal limits. Opacities at the bilateral lung bases with obscuration of the hemidiaphragm, blunting of the costophrenic angles and blunting of the costophrenic sulcus. Patchy airspace opacities bilateral lung bases. No pneumothorax. Thickening of the fissures. IMPRESSION: Bilateral pleural effusions with associated patchy opacities, potentially representing atelectasis, scarring, or consolidation/infection. Electronically Signed   By: Corrie Mckusick D.O.   On: 06/05/2018 13:00   Ct Angio Chest Pe W Or Wo Contrast  Result Date: 06/16/2018 CLINICAL DATA:  Shortness of breath. EXAM: CT ANGIOGRAPHY CHEST WITH CONTRAST TECHNIQUE: Multidetector CT imaging of the chest was performed using the standard protocol during bolus administration of intravenous contrast. Multiplanar CT image reconstructions and MIPs were obtained to evaluate the vascular anatomy. CONTRAST:  121mL ISOVUE-370 IOPAMIDOL (ISOVUE-370) INJECTION 76% COMPARISON:  Chest radiograph, 06/15/2018 FINDINGS: Cardiovascular: There is satisfactory opacification of the pulmonary arteries to the segmental level. There is no evidence of a pulmonary embolism. Heart is normal in size and configuration. No pericardial effusion. No  coronary artery calcifications. Great vessels are normal in caliber. No aortic dissection or atherosclerosis. Mediastinum/Nodes: No mediastinal or hilar masses.  No enlarged lymph nodes. Trachea and esophagus are unremarkable. There is increased attenuation in the mediastinal and hilar fat consistent with edema. Lungs/Pleura: Large bilateral pleural effusions. Complete atelectasis of both lower lobes. Mild dependent atelectasis noted in the upper lobes and right middle lobe. Minor areas of ground-glass opacity in the superior aspect of the upper lobes, also likely atelectasis. No convincing pneumonia or pulmonary edema. No lung mass or suspicious nodule. No pneumothorax. Upper Abdomen: No acute abnormality. Musculoskeletal: No fracture or acute finding. No osteoblastic or osteolytic lesions. Review of the MIP images confirms the above findings. IMPRESSION: 1. No evidence of a pulmonary embolism. 2. Large bilateral pleural effusions with complete lower lobe atelectasis bilaterally. Mild dependent atelectasis in the upper lobes and right middle lobe. 3. No convincing pneumonia or pulmonary edema. Electronically Signed   By: Lajean Manes M.D.   On: 06/16/2018 11:42   Mm Digital Screening Bilateral  Result Date: 05/23/2018 CLINICAL DATA:  Screening. EXAM: DIGITAL SCREENING BILATERAL MAMMOGRAM WITH CAD COMPARISON:  Previous exam(s). ACR Breast Density Category c: The breast tissue is heterogeneously dense, which may obscure small masses. FINDINGS: There are no findings suspicious for malignancy. Images were processed with CAD. IMPRESSION: No mammographic evidence of malignancy. A result letter of this screening mammogram will be mailed directly to the patient. RECOMMENDATION: Screening mammogram in one year. (Code:SM-B-01Y) BI-RADS CATEGORY  1: Negative. Electronically Signed   By: Lovey Newcomer M.D.   On: 05/23/2018 16:00   Ir Thoracentesis Asp Pleural Space W/img Guide  Result Date: 06/17/2018 INDICATION: Patient  with bilateral pleural effusions. Request is made for diagnostic and therapeutic left thoracentesis. EXAM: ULTRASOUND GUIDED DIAGNOSTIC AND THERAPEUTIC LEFT THORACENTESIS MEDICATIONS: 10 mL 1% lidocaine COMPLICATIONS: None immediate. PROCEDURE: An ultrasound guided thoracentesis was thoroughly discussed with the patient and questions answered. The benefits, risks, alternatives and complications were also discussed. The patient understands and wishes to proceed with the procedure. Written consent was obtained. Ultrasound was performed to localize and mark an adequate pocket of fluid in the left chest. The area was then prepped and draped in the normal sterile fashion. 1% Lidocaine was used for local anesthesia. Under ultrasound guidance a Safe-T-Centesis catheter was introduced. Thoracentesis was performed. The catheter was removed and a dressing applied. FINDINGS: A total of approximately 800 mL of amber fluid was removed. Samples were sent to the laboratory as requested by the clinical team. IMPRESSION: Successful ultrasound guided diagnostic and therapeutic left thoracentesis yielding 800 mL of pleural fluid. Read by: Brynda Greathouse PA-C Electronically Signed   By: Sandi Mariscal M.D.   On: 06/17/2018 14:41    Time Spent in minutes  30   Lala Lund M.D on 06/19/2018 at 11:37 AM  To page go to [email or web address] - password [facility]

## 2018-06-19 NOTE — Care Management Important Message (Signed)
Important Message  Patient Details  Name: Ann Cline MRN: [id number] Date of Birth: 10-17-1928   Medicare Important Message Given:  Yes    Orbie Pyo 06/19/2018, 4:26 PM

## 2018-06-20 ENCOUNTER — Telehealth: Payer: Self-pay

## 2018-06-20 DIAGNOSIS — R0602 Shortness of breath: Secondary | ICD-10-CM

## 2018-06-20 LAB — CULTURE, BLOOD (ROUTINE X 2)
Culture: NO GROWTH
Culture: NO GROWTH
SPECIAL REQUESTS: ADEQUATE
Special Requests: ADEQUATE

## 2018-06-20 MED ORDER — FERROUS SULFATE 325 (65 FE) MG PO TABS: 325.0000 mg | ORAL_TABLET | Freq: Two times a day (BID) | ORAL | 0 refills | Status: AC

## 2018-06-20 MED ORDER — CARVEDILOL 3.125 MG PO TABS: 3.1250 mg | ORAL_TABLET | Freq: Two times a day (BID) | ORAL | 0 refills | Status: DC

## 2018-06-20 MED ORDER — FUROSEMIDE 40 MG PO TABS: 40.0000 mg | ORAL_TABLET | Freq: Every day | ORAL | 0 refills | Status: DC

## 2018-06-20 MED ORDER — DOCUSATE SODIUM 100 MG PO CAPS: 200.0000 mg | ORAL_CAPSULE | Freq: Two times a day (BID) | ORAL | 0 refills | Status: AC | PRN

## 2018-06-20 MED ORDER — POTASSIUM CHLORIDE ER 10 MEQ PO TBCR: 10.0000 meq | EXTENDED_RELEASE_TABLET | Freq: Every day | ORAL | 0 refills | Status: DC

## 2018-06-20 NOTE — Telephone Encounter (Signed)
  Oncology Nurse Navigator Documentation     Called and spoke with patient prior to her discharge from [facility]. Discussed options for oncology referral. Patient lives closer to [facility]. Referral placed.

## 2018-06-20 NOTE — Discharge Instructions (Signed)
Follow with Primary MD Philmore Pali, NP in 7 days   Get CBC, CMP, 2 view Chest X ray -  checked  by Primary MD  in 5-7 days   Activity: As tolerated with Full fall precautions use walker/cane & assistance as needed  Disposition Home    Diet: Soft Heart Healthy with strict 1.5 L/day total fluid restriction.  Special Instructions: If you have smoked or chewed Tobacco  in the last 2 yrs please stop smoking, stop any regular Alcohol  and or any Recreational drug use.  On your next visit with your primary care physician please Get Medicines reviewed and adjusted.  Please request your Prim.MD to go over all Hospital Tests and Procedure/Radiological results at the follow up, please get all Hospital records sent to your Prim MD by signing hospital release before you go home.  If you experience worsening of your admission symptoms, develop shortness of breath, life threatening emergency, suicidal or homicidal thoughts you must seek medical attention immediately by calling 911 or calling your MD immediately  if symptoms less severe.  You Must read complete instructions/literature along with all the possible adverse reactions/side effects for all the Medicines you take and that have been prescribed to you. Take any new Medicines after you have completely understood and accpet all the possible adverse reactions/side effects.

## 2018-06-20 NOTE — Discharge Summary (Signed)
Ann Cline QMG:[id number] DOB: 1929/05/01 DOA: 06/15/2018  PCP: Philmore Pali, NP  Admit date: 06/15/2018  Discharge date: 06/20/2018  Admitted From: Home   Disposition:  Home   Recommendations for Outpatient Follow-up:   Follow up with PCP in 1-2 weeks  PCP Please obtain BMP/CBC, 2 view CXR in 1week,  (see Discharge instructions)   PCP Please follow up on the following pending results: Follow-up on pending pleural fluid cytology, needs outpatient Oncology and cardiology follow-up.  Monitor BMP.   Home Health: PT, RN, SLP Equipment/Devices: Rolling walker  Consultations: NONE Discharge Condition: Stable   CODE STATUS: Full   Diet Recommendation: Heart Healthy with 1.2 L/day fluid restriction    Chief Complaint  Patient presents with  . Shortness of Breath  . Cough  . Nausea     Brief history of present illness from the day of admission and additional interim summary    Ann Cline an 85 y.o.femalewithno significant pastmedical historypresenting with SOB.She couldn't do anything else besides come to the ER. She has been a little SOB and had some discomfort with breathing but then it got worse. She was diagnosed with PNA about 10 days and was given Levaquin x 7 days. She completed the course on Tuesday but the SOB started right after she finished it.                                                                 Hospital Course   1.  Acute hypoxic respiratory failure due to acute on chronic diastolic CHF with EF 89%.  Stopped IV fluids and IV antibiotics, was diuresed with IV Lasix and also placed on oral Coreg with good effect.  Now back to room air.  No shortness of breath or orthopnea.  Will be discharged on oral Lasix and Coreg combination with PCP follow-up.  2.  Dysphagia.   Speech following currently on dysphagia 3 diet. SLP at home along with PT and RN.  3.  Essential hypertension. Stable on Coreg and diuretic.Marland Kitchen  4.  Steroid-induced hyperglycemia.  Stopped steroids.  Will monitor.  5.  History of osteoporosis.  Resume home medication upon discharge.  6.  Mild chronic microcytic iron deficiency anemia.  Placed on oral iron, outpatient GI follow-up for age-appropriate outpatient work-up.  7.  Exudative effusion on the left also effusion on the right.  Unfortunately cytology consistent with malignant cells. Will Have her follow-up with Dr. Julien Nordmann in 1 to 2 weeks.  Will require extensive outpatient work-up and management.  Discussed with oncology scheduler Hinton Dyer who will have the patient come to the office within a week.   Discharge diagnosis     Principal Problem:   Pneumonia Active Problems:   Hyperglycemia   HCAP (healthcare-associated pneumonia)    Discharge instructions  Discharge Instructions    Diet - low sodium heart healthy   Complete by:  As directed    Discharge instructions   Complete by:  As directed    Follow with Primary MD Philmore Pali, NP in 7 days   Get CBC, CMP, 2 view Chest X ray -  checked  by Primary MD  in 5-7 days   Activity: As tolerated with Full fall precautions use walker/cane & assistance as needed  Disposition Home    Diet: Soft Heart Healthy with strict 1.5 L/day total fluid restriction.  Special Instructions: If you have smoked or chewed Tobacco  in the last 2 yrs please stop smoking, stop any regular Alcohol  and or any Recreational drug use.  On your next visit with your primary care physician please Get Medicines reviewed and adjusted.  Please request your Prim.MD to go over all Hospital Tests and Procedure/Radiological results at the follow up, please get all Hospital records sent to your Prim MD by signing hospital release before you go home.  If you experience worsening of your admission symptoms,  develop shortness of breath, life threatening emergency, suicidal or homicidal thoughts you must seek medical attention immediately by calling 911 or calling your MD immediately  if symptoms less severe.  You Must read complete instructions/literature along with all the possible adverse reactions/side effects for all the Medicines you take and that have been prescribed to you. Take any new Medicines after you have completely understood and accpet all the possible adverse reactions/side effects.   Increase activity slowly   Complete by:  As directed       Discharge Medications   Allergies as of 06/20/2018   No Known Allergies     Medication List    TAKE these medications   acetaminophen 500 MG tablet Commonly known as:  TYLENOL Take 1,000 mg by mouth every 6 (six) hours as needed for mild pain or headache.   alendronate 70 MG tablet Commonly known as:  FOSAMAX Take 70 mg by mouth once a week. Tuesday   carboxymethylcellulose 0.5 % Soln Commonly known as:  REFRESH PLUS Place 1 drop into both eyes daily as needed (dry eyes).   carvedilol 3.125 MG tablet Commonly known as:  COREG Take 1 tablet (3.125 mg total) by mouth 2 (two) times daily with a meal.   docusate sodium 100 MG capsule Commonly known as:  COLACE Take 2 capsules (200 mg total) by mouth 2 (two) times daily as needed for mild constipation.   ferrous sulfate 325 (65 FE) MG tablet Take 1 tablet (325 mg total) by mouth 2 (two) times daily with a meal.   furosemide 40 MG tablet Commonly known as:  LASIX Take 1 tablet (40 mg total) by mouth daily.   multivitamin capsule Take 1 capsule by mouth daily.   Omega-3 1000 MG Caps Take 1 capsule by mouth daily.   potassium chloride 10 MEQ tablet Commonly known as:  K-DUR Take 1 tablet (10 mEq total) by mouth daily.            Durable Medical Equipment  (From admission, onward)         Start     Ordered   06/20/18 1005  For home use only DME Walker rolling   Once    Comments:  5 wheel  Question:  Patient needs a walker to treat with the following condition  Answer:  CHF (congestive heart failure) ([place])   06/20/18 1004   06/19/18  1050  For home use only DME Walker rolling  Once    Question:  Patient needs a walker to treat with the following condition  Answer:  Weakness   06/19/18 1050          Follow-up Information    [facility].   Specialty:  Home Health Services Why:  home health services arranged Contact information: [place] [address] [phone]        Philmore Pali, NP. Schedule an appointment as soon as possible for a visit in 1 week(s).   Specialty:  Nurse Practitioner Contact information: [place] [address] [phone]        Jerline Pain, MD. Schedule an appointment as soon as possible for a visit in 1 week(s).   Specialty:  Cardiology Why:  chf Contact information: [address] [place] [address] [phone]        Curt Bears, MD. Schedule an appointment as soon as possible for a visit in 1 week(s).   Specialty:  Oncology Why:  Malignant pleural effusion Contact information: [place] [address] [phone]           Major procedures and Radiology Reports - PLEASE review detailed and final reports thoroughly  -        Dg Chest 1 View  Result Date: 06/17/2018 CLINICAL DATA:  Left thoracentesis EXAM: CHEST  1 VIEW COMPARISON:  June 15, 2018 FINDINGS: There is a moderate to large right-sided pleural effusion, more prominent the interval. The left-sided pleural effusion persists but may be smaller. No pneumothorax. No change in the cardiomediastinal silhouette. IMPRESSION: 1. Moderate to large right pleural effusion, more prominent the interval. 2. The left pleural effusion is smaller in the interval consistent with thoracentesis. No pneumothorax. 3. No other changes.  Electronically Signed   By: Dorise Bullion III M.D   On: 06/17/2018 14:33   Dg Chest 2 View  Result Date: 06/19/2018 CLINICAL DATA:  Shortness of breath, respiratory failure EXAM: CHEST - 2 VIEW COMPARISON:  06/17/2018 FINDINGS: Cardiac shadow is enlarged but stable. Bilateral pleural effusions and basilar atelectasis/infiltrate is again seen although slightly improved. This may be in part due to patient upright positioning. No pneumothorax is noted. No acute bony abnormality is seen. IMPRESSION: Bibasilar atelectasis/infiltrate with associated effusions right greater than left. The overall appearance is improved from the prior exam although this may be in part positional in nature. Electronically Signed   By: Inez Catalina M.D.   On: 06/19/2018 08:33   Dg Chest 2 View  Result Date: 06/15/2018 CLINICAL DATA:  Short of breath for 3 days. Productive cough for a week. EXAM: CHEST - 2 VIEW COMPARISON:  06/05/2018 FINDINGS: Moderate bilateral pleural effusions, increased in size from prior study. There is lung base opacity med is likely atelectasis. Can not exclude pneumonia. Upper lungs are clear.  No evidence of pulmonary edema. Cardiac silhouette is partly obscured. It is grossly normal in size. No pneumothorax. IMPRESSION: 1. Increased size of the bilateral pleural effusions since the prior study. There is associated lung base opacity consistent with atelectasis, pneumonia or a combination. 2. No evidence of pulmonary edema. Electronically Signed   By: Lajean Manes M.D.   On: 06/15/2018 13:36   Dg Chest 2 View  Result Date: 06/05/2018 CLINICAL DATA:  85 year old female with a history of pleural effusion on prior ultrasound abdomen EXAM: CHEST - 2 VIEW COMPARISON:  None. FINDINGS: Cardiomediastinal silhouette within normal limits. Opacities at the bilateral lung bases with obscuration of the hemidiaphragm, blunting of the costophrenic angles and blunting of the costophrenic sulcus. Patchy airspace opacities  bilateral lung bases. No pneumothorax. Thickening of the fissures. IMPRESSION: Bilateral pleural effusions with associated patchy opacities, potentially representing atelectasis, scarring, or consolidation/infection. Electronically Signed   By: Corrie Mckusick D.O.   On: 06/05/2018 13:00   Ct Angio Chest Pe W Or Wo Contrast  Result Date: 06/16/2018 CLINICAL DATA:  Shortness of breath. EXAM: CT ANGIOGRAPHY CHEST WITH CONTRAST TECHNIQUE: Multidetector CT imaging of the chest was performed using the standard protocol during bolus administration of intravenous contrast. Multiplanar CT image reconstructions and MIPs were obtained to evaluate the vascular anatomy. CONTRAST:  163mL ISOVUE-370 IOPAMIDOL (ISOVUE-370) INJECTION 76% COMPARISON:  Chest radiograph, 06/15/2018 FINDINGS: Cardiovascular: There is satisfactory opacification of the pulmonary arteries to the segmental level. There is no evidence of a pulmonary embolism. Heart is normal in size and configuration. No pericardial effusion. No coronary artery calcifications. Great vessels are normal in caliber. No aortic dissection or atherosclerosis. Mediastinum/Nodes: No mediastinal or hilar masses. No enlarged lymph nodes. Trachea and esophagus are unremarkable. There is increased attenuation in the mediastinal and hilar fat consistent with edema. Lungs/Pleura: Large bilateral pleural effusions. Complete atelectasis of both lower lobes. Mild dependent atelectasis noted in the upper lobes and right middle lobe. Minor areas of ground-glass opacity in the superior aspect of the upper lobes, also likely atelectasis. No convincing pneumonia or pulmonary edema. No lung mass or suspicious nodule. No pneumothorax. Upper Abdomen: No acute abnormality. Musculoskeletal: No fracture or acute finding. No osteoblastic or osteolytic lesions. Review of the MIP images confirms the above findings. IMPRESSION: 1. No evidence of a pulmonary embolism. 2. Large bilateral pleural effusions  with complete lower lobe atelectasis bilaterally. Mild dependent atelectasis in the upper lobes and right middle lobe. 3. No convincing pneumonia or pulmonary edema. Electronically Signed   By: Lajean Manes M.D.   On: 06/16/2018 11:42   Mm Digital Screening Bilateral  Result Date: 05/23/2018 CLINICAL DATA:  Screening. EXAM: DIGITAL SCREENING BILATERAL MAMMOGRAM WITH CAD COMPARISON:  Previous exam(s). ACR Breast Density Category c: The breast tissue is heterogeneously dense, which may obscure small masses. FINDINGS: There are no findings suspicious for malignancy. Images were processed with CAD. IMPRESSION: No mammographic evidence of malignancy. A result letter of this screening mammogram will be mailed directly to the patient. RECOMMENDATION: Screening mammogram in one year. (Code:SM-B-01Y) BI-RADS CATEGORY  1: Negative. Electronically Signed   By: Lovey Newcomer M.D.   On: 05/23/2018 16:00   Ir Thoracentesis Asp Pleural Space W/img Guide  Result Date: 06/17/2018 INDICATION: Patient with bilateral pleural effusions. Request is made for diagnostic and therapeutic left thoracentesis. EXAM: ULTRASOUND GUIDED DIAGNOSTIC AND THERAPEUTIC LEFT THORACENTESIS MEDICATIONS: 10 mL 1% lidocaine COMPLICATIONS: None immediate. PROCEDURE: An ultrasound guided thoracentesis was thoroughly discussed with the patient and questions answered. The benefits, risks, alternatives and complications were also discussed. The patient understands and wishes to proceed with the procedure. Written consent was obtained. Ultrasound was performed to localize and mark an adequate pocket of fluid in the left chest. The area was then prepped and draped in the normal sterile fashion. 1% Lidocaine was used for local anesthesia. Under ultrasound guidance a Safe-T-Centesis catheter was introduced. Thoracentesis was performed. The catheter was removed and a dressing applied. FINDINGS: A total of approximately 800 mL of amber fluid was removed. Samples  were sent to the laboratory  as requested by the clinical team. IMPRESSION: Successful ultrasound guided diagnostic and therapeutic left thoracentesis yielding 800 mL of pleural fluid. Read by: Brynda Greathouse PA-C Electronically Signed   By: Sandi Mariscal M.D.   On: 06/17/2018 14:41    Micro Results     Recent Results (from the past 240 hour(s))  Blood culture (routine x 2)     Status: None   Collection Time: 06/15/18  2:47 PM  Result Value Ref Range Status   Specimen Description BLOOD RIGHT ANTECUBITAL  Final   Special Requests   Final    BOTTLES DRAWN AEROBIC AND ANAEROBIC Blood Culture adequate volume   Culture   Final    NO GROWTH 5 DAYS Performed at [facility], [address] [place], [place] [address]    Report Status 06/20/2018 FINAL  Final  Blood culture (routine x 2)     Status: None   Collection Time: 06/15/18  4:08 PM  Result Value Ref Range Status   Specimen Description BLOOD RIGHT ANTECUBITAL  Final   Special Requests   Final    BOTTLES DRAWN AEROBIC AND ANAEROBIC Blood Culture adequate volume   Culture   Final    NO GROWTH 5 DAYS Performed at [facility], [place] [address], [place], [place] [address]    Report Status 06/20/2018 FINAL  Final  Respiratory Panel by PCR     Status: None   Collection Time: 06/15/18  5:45 PM  Result Value Ref Range Status   Adenovirus NOT DETECTED NOT DETECTED Final   Coronavirus 229E NOT DETECTED NOT DETECTED Final   Coronavirus HKU1 NOT DETECTED NOT DETECTED Final   Coronavirus NL63 NOT DETECTED NOT DETECTED Final   Coronavirus OC43 NOT DETECTED NOT DETECTED Final   Metapneumovirus NOT DETECTED NOT DETECTED Final   Rhinovirus / Enterovirus NOT DETECTED NOT DETECTED Final   Influenza A NOT DETECTED NOT DETECTED Final   Influenza B NOT DETECTED NOT DETECTED Final   Parainfluenza Virus 1 NOT DETECTED NOT DETECTED Final   Parainfluenza Virus 2 NOT DETECTED NOT DETECTED Final   Parainfluenza Virus 3 NOT DETECTED NOT DETECTED  Final   Parainfluenza Virus 4 NOT DETECTED NOT DETECTED Final   Respiratory Syncytial Virus NOT DETECTED NOT DETECTED Final   Bordetella pertussis NOT DETECTED NOT DETECTED Final   Chlamydophila pneumoniae NOT DETECTED NOT DETECTED Final   Mycoplasma pneumoniae NOT DETECTED NOT DETECTED Final    Comment: Performed at [facility], [place] [address] [place], [place] [address]  Culture, sputum-assessment     Status: None   Collection Time: 06/15/18  8:21 PM  Result Value Ref Range Status   Specimen Description SPUTUM  Final   Special Requests NONE  Final   Sputum evaluation   Final    Sputum specimen not acceptable for testing.  Please recollect.   RESULT CALLED TO, READ BACK BY AND VERIFIED WITH: RN Hulda Marin 7741 [phone]    Report Status 06/16/2018 FINAL  Final  Expectorated sputum assessment w rflx to resp cult     Status: None   Collection Time: 06/16/18  8:07 AM  Result Value Ref Range Status   Specimen Description EXPECTORATED SPUTUM  Final   Special Requests NONE  Final   Sputum evaluation   Final    THIS SPECIMEN IS ACCEPTABLE FOR SPUTUM CULTURE Performed at [facility], [place] [address], [place], [place] [address]    Report Status 06/16/2018 FINAL  Final  Culture,  respiratory     Status: None   Collection Time: 06/16/18  8:07 AM  Result Value Ref Range Status   Specimen Description EXPECTORATED SPUTUM  Final   Special Requests   Final    NONE Reflexed from [id number] Performed at [facility], [address] [place], [place] [address]    Gram Stain   Final    FEW SQUAMOUS EPITHELIAL CELLS PRESENT FEW WBC PRESENT, PREDOMINANTLY PMN FEW GRAM POSITIVE COCCI RARE GRAM NEGATIVE RODS RARE GRAM POSITIVE RODS    Culture FEW Consistent with normal respiratory flora.  Final   Report Status 06/19/2018 FINAL  Final  Gram stain     Status: None   Collection Time: 06/17/18  2:02 PM  Result Value Ref Range Status   Specimen Description PLEURAL LEFT  Final    Special Requests NONE  Final   Gram Stain   Final    FEW WBC PRESENT,BOTH PMN AND MONONUCLEAR NO ORGANISMS SEEN Performed at [facility], [address] [place], [place] [address]    Report Status 06/17/2018 FINAL  Final  Culture, body fluid-bottle     Status: None (Preliminary result)   Collection Time: 06/17/18  2:02 PM  Result Value Ref Range Status   Specimen Description PLEURAL LEFT  Final   Special Requests NONE  Final   Culture   Final    NO GROWTH 3 DAYS Performed at [facility] [address], [place], [place] [address]    Report Status PENDING  Incomplete    Today   Subjective    Mckell Riecke today has no headache,no chest abdominal pain,no new weakness tingling or numbness, feels much better wants to go home today.     Objective   Blood pressure 114/65, pulse 71, temperature 98.3 F (36.8 C), temperature source Oral, resp. rate 16, SpO2 94 %.  No intake or output data in the 24 hours ending 06/20/18 1017  Exam  Awake Alert, Oriented x 3, No new F.N deficits, Normal affect [place].AT,PERRAL Supple Neck,No JVD, No cervical lymphadenopathy appriciated.  Symmetrical Chest wall movement, Good air movement bilaterally, CTAB RRR,No Gallops,Rubs or new Murmurs, No Parasternal Heave +ve B.Sounds, Abd Soft, Non tender, No organomegaly appriciated, No rebound -guarding or rigidity. No Cyanosis, Clubbing or edema, No new Rash or bruise   Data Review   CBC w Diff:  Lab Results  Component Value Date   WBC 6.7 06/18/2018   HGB 11.1 (L) 06/18/2018   HCT 35.9 (L) 06/18/2018   PLT 201 06/18/2018   LYMPHOPCT 18 06/16/2018   MONOPCT 4 06/16/2018   EOSPCT 0 06/16/2018   BASOPCT 0 06/16/2018    CMP:  Lab Results  Component Value Date   NA 134 (L) 06/19/2018   K 3.6 06/19/2018   CL 97 (L) 06/19/2018   CO2 24 06/19/2018   BUN 19 06/19/2018   CREATININE 1.07 (H) 06/19/2018   PROT 5.9 (L) 06/17/2018   ALBUMIN 2.9 (L) 06/17/2018   BILITOT 0.7  06/17/2018   ALKPHOS 88 06/17/2018   AST 19 06/17/2018   ALT 14 06/17/2018  .   Total Time in preparing paper work, data evaluation and todays exam - 100 minutes  Lala Lund M.D on 06/20/2018 at 10:17 AM  [facility]   Office  [phone]

## 2018-06-22 DIAGNOSIS — I5033 Acute on chronic diastolic (congestive) heart failure: Secondary | ICD-10-CM | POA: Diagnosis not present

## 2018-06-22 DIAGNOSIS — I110 Hypertensive heart disease with heart failure: Secondary | ICD-10-CM | POA: Diagnosis not present

## 2018-06-22 DIAGNOSIS — R1310 Dysphagia, unspecified: Secondary | ICD-10-CM | POA: Diagnosis not present

## 2018-06-22 DIAGNOSIS — J189 Pneumonia, unspecified organism: Secondary | ICD-10-CM | POA: Diagnosis not present

## 2018-06-22 DIAGNOSIS — J918 Pleural effusion in other conditions classified elsewhere: Secondary | ICD-10-CM | POA: Diagnosis not present

## 2018-06-22 DIAGNOSIS — M810 Age-related osteoporosis without current pathological fracture: Secondary | ICD-10-CM | POA: Diagnosis not present

## 2018-06-22 DIAGNOSIS — D649 Anemia, unspecified: Secondary | ICD-10-CM | POA: Diagnosis not present

## 2018-06-22 LAB — CULTURE, BODY FLUID W GRAM STAIN -BOTTLE

## 2018-06-22 LAB — CULTURE, BODY FLUID-BOTTLE: CULTURE: NO GROWTH

## 2018-06-25 DIAGNOSIS — I110 Hypertensive heart disease with heart failure: Secondary | ICD-10-CM | POA: Diagnosis not present

## 2018-06-25 DIAGNOSIS — I5033 Acute on chronic diastolic (congestive) heart failure: Secondary | ICD-10-CM | POA: Diagnosis not present

## 2018-06-25 DIAGNOSIS — J189 Pneumonia, unspecified organism: Secondary | ICD-10-CM | POA: Diagnosis not present

## 2018-06-25 DIAGNOSIS — R1310 Dysphagia, unspecified: Secondary | ICD-10-CM | POA: Diagnosis not present

## 2018-06-25 DIAGNOSIS — M810 Age-related osteoporosis without current pathological fracture: Secondary | ICD-10-CM | POA: Diagnosis not present

## 2018-06-25 DIAGNOSIS — J918 Pleural effusion in other conditions classified elsewhere: Secondary | ICD-10-CM | POA: Diagnosis not present

## 2018-06-25 DIAGNOSIS — D649 Anemia, unspecified: Secondary | ICD-10-CM | POA: Diagnosis not present

## 2018-06-26 DIAGNOSIS — Z7901 Long term (current) use of anticoagulants: Secondary | ICD-10-CM | POA: Diagnosis not present

## 2018-06-26 DIAGNOSIS — C801 Malignant (primary) neoplasm, unspecified: Secondary | ICD-10-CM | POA: Diagnosis not present

## 2018-06-26 DIAGNOSIS — J910 Malignant pleural effusion: Secondary | ICD-10-CM | POA: Diagnosis not present

## 2018-06-26 DIAGNOSIS — R978 Other abnormal tumor markers: Secondary | ICD-10-CM | POA: Diagnosis not present

## 2018-06-26 DIAGNOSIS — J90 Pleural effusion, not elsewhere classified: Secondary | ICD-10-CM | POA: Diagnosis not present

## 2018-06-27 DIAGNOSIS — J910 Malignant pleural effusion: Secondary | ICD-10-CM | POA: Diagnosis not present

## 2018-06-27 DIAGNOSIS — J90 Pleural effusion, not elsewhere classified: Secondary | ICD-10-CM | POA: Diagnosis not present

## 2018-06-27 DIAGNOSIS — Z859 Personal history of malignant neoplasm, unspecified: Secondary | ICD-10-CM | POA: Diagnosis not present

## 2018-06-28 DIAGNOSIS — Z6824 Body mass index (BMI) 24.0-24.9, adult: Secondary | ICD-10-CM | POA: Diagnosis not present

## 2018-06-28 DIAGNOSIS — R1310 Dysphagia, unspecified: Secondary | ICD-10-CM | POA: Diagnosis not present

## 2018-06-28 DIAGNOSIS — D649 Anemia, unspecified: Secondary | ICD-10-CM | POA: Diagnosis not present

## 2018-06-28 DIAGNOSIS — I5033 Acute on chronic diastolic (congestive) heart failure: Secondary | ICD-10-CM | POA: Diagnosis not present

## 2018-06-28 DIAGNOSIS — R799 Abnormal finding of blood chemistry, unspecified: Secondary | ICD-10-CM | POA: Diagnosis not present

## 2018-06-28 DIAGNOSIS — J918 Pleural effusion in other conditions classified elsewhere: Secondary | ICD-10-CM | POA: Diagnosis not present

## 2018-06-28 DIAGNOSIS — Z79899 Other long term (current) drug therapy: Secondary | ICD-10-CM | POA: Diagnosis not present

## 2018-06-28 DIAGNOSIS — M810 Age-related osteoporosis without current pathological fracture: Secondary | ICD-10-CM | POA: Diagnosis not present

## 2018-06-28 DIAGNOSIS — J189 Pneumonia, unspecified organism: Secondary | ICD-10-CM | POA: Diagnosis not present

## 2018-06-28 DIAGNOSIS — I110 Hypertensive heart disease with heart failure: Secondary | ICD-10-CM | POA: Diagnosis not present

## 2018-07-01 ENCOUNTER — Encounter: Payer: Self-pay | Admitting: Cardiology

## 2018-07-01 ENCOUNTER — Ambulatory Visit (INDEPENDENT_AMBULATORY_CARE_PROVIDER_SITE_OTHER): Payer: Medicare HMO | Admitting: Cardiology

## 2018-07-01 DIAGNOSIS — I5030 Unspecified diastolic (congestive) heart failure: Secondary | ICD-10-CM

## 2018-07-01 DIAGNOSIS — I509 Heart failure, unspecified: Secondary | ICD-10-CM | POA: Insufficient documentation

## 2018-07-01 MED ORDER — CARVEDILOL 3.125 MG PO TABS: 3.1250 mg | ORAL_TABLET | Freq: Two times a day (BID) | ORAL | 2 refills | Status: AC

## 2018-07-01 MED ORDER — CARVEDILOL 3.125 MG PO TABS: 3.1250 mg | ORAL_TABLET | Freq: Two times a day (BID) | ORAL | 2 refills | Status: DC

## 2018-07-01 NOTE — Patient Instructions (Signed)

## 2018-07-01 NOTE — Progress Notes (Signed)
Cardiology Office Note:    Date:  07/01/2018   ID:  Ann Cline, DOB 06-27-1928, MRN [id number]  PCP:  Philmore Pali, NP  Cardiologist:  Jenean Lindau, MD   Referring MD: Karmen Bongo, MD    ASSESSMENT:    1. Diastolic congestive heart failure, unspecified HF chronicity ([place])    PLAN:    In order of problems listed above:  1. Primary prevention stressed with the patient.  Importance of compliance with diet and medication stressed and she vocalized understanding.  Her blood pressure is stable now.  Congestive heart failure education was given including diet, salt intake and weighing herself regularly.  She understands and already knew about this. 2. I have refilled her carvedilol.  I also mentioned to the patient that she should continue diuretic therapy in view of congestive heart failure and pleural effusion.  I obtained an x-ray done last week at [facility] and reviewed with her.  I will refill her carvedilol.  Her diuretic will need to be refilled by her primary care physician who will be monitoring her electrolytes on a regular basis. 3. Patient will be seen in follow-up appointment in 6 months or earlier if the patient has any concerns    Medication Adjustments/Labs and Tests Ordered: Current medicines are reviewed at length with the patient today.  Concerns regarding medicines are outlined above.  No orders of the defined types were placed in this encounter.  No orders of the defined types were placed in this encounter.    History of Present Illness:    Ann Cline is a 85 y.o. female who is being seen today for the evaluation of congestive heart failure at the request of Karmen Bongo, MD.  Patient is a pleasant 85 year old female.  She is brought in by her daughter.  The patient was admitted to the hospital for congestive heart failure.  She had bilateral pleural effusions and underwent pleural tap subsequently is done fine.  She takes care of  activities of daily living.  She leads a sedentary lifestyle age appropriately.  No chest pain orthopnea or PND.  They were told that there were some possibility of cancer cells in the fluid and she is seeing oncologist.  However the oncologist has mentioned to her.  I told him to refer to this issue with her oncologist.  Patient denies any chest pain orthopnea or PND.  I reviewed hospital records extensively.  History reviewed. No pertinent past medical history.  Past Surgical History:  Procedure Laterality Date  . ABDOMINAL HYSTERECTOMY  1975  . IR THORACENTESIS ASP PLEURAL SPACE W/IMG GUIDE  06/17/2018    Current Medications: Current Meds  Medication Sig  . acetaminophen (TYLENOL) 500 MG tablet Take 1,000 mg by mouth every 6 (six) hours as needed for mild pain or headache.  . alendronate (FOSAMAX) 70 MG tablet Take 70 mg by mouth once a week. Tuesday  . carboxymethylcellulose (REFRESH PLUS) 0.5 % SOLN Place 1 drop into both eyes daily as needed (dry eyes).  . carvedilol (COREG) 3.125 MG tablet Take 1 tablet (3.125 mg total) by mouth 2 (two) times daily with a meal.  . docusate sodium (COLACE) 100 MG capsule Take 2 capsules (200 mg total) by mouth 2 (two) times daily as needed for mild constipation.  . ferrous sulfate 325 (65 FE) MG tablet Take 1 tablet (325 mg total) by mouth 2 (two) times daily with a meal.  . furosemide (LASIX) 40 MG tablet Take 1  tablet (40 mg total) by mouth daily.  . Multiple Vitamin (MULTIVITAMIN) capsule Take 1 capsule by mouth daily.  . potassium chloride (K-DUR) 10 MEQ tablet Take 1 tablet (10 mEq total) by mouth daily.     Allergies:   Patient has no known allergies.   Social History   Socioeconomic History  . Marital status: Widowed    Spouse name: Not on file  . Number of children: Not on file  . Years of education: Not on file  . Highest education level: Not on file  Occupational History  . Not on file  Social Needs  . Financial resource strain: Not  on file  . Food insecurity:    Worry: Not on file    Inability: Not on file  . Transportation needs:    Medical: Not on file    Non-medical: Not on file  Tobacco Use  . Smoking status: Never Smoker  . Smokeless tobacco: Never Used  Substance and Sexual Activity  . Alcohol use: Never    Frequency: Never  . Drug use: Never  . Sexual activity: Not on file  Lifestyle  . Physical activity:    Days per week: Not on file    Minutes per session: Not on file  . Stress: Not on file  Relationships  . Social connections:    Talks on phone: Not on file    Gets together: Not on file    Attends religious service: Not on file    Active member of club or organization: Not on file    Attends meetings of clubs or organizations: Not on file    Relationship status: Not on file  Other Topics Concern  . Not on file  Social History Narrative  . Not on file     Family History: The patient's family history includes Cancer in her brother and sister.  ROS:   Please see the history of present illness.    All other systems reviewed and are negative.  EKGs/Labs/Other Studies Reviewed:    The following studies were reviewed today: I discussed my findings with the patient.  Echocardiogram reveals normal systolic function.   Recent Labs: 06/15/2018: B Natriuretic Peptide 18.0 06/17/2018: ALT 14 06/18/2018: Hemoglobin 11.1; Magnesium 2.2; Platelets 201 06/19/2018: BUN 19; Creatinine, Ser 1.07; Potassium 3.6; Sodium 134  Recent Lipid Panel No results found for: CHOL, TRIG, HDL, CHOLHDL, VLDL, LDLCALC, LDLDIRECT  Physical Exam:    VS:  BP 122/70 (BP Location: Right Arm, Patient Position: Sitting, Cuff Size: Normal)   Pulse 84   Ht 5\' 1"  (1.549 m)   Wt 133 lb (60.3 kg)   SpO2 95%   BMI 25.13 kg/m     Wt Readings from Last 3 Encounters:  07/01/18 133 lb (60.3 kg)     GEN: Patient is in no acute distress HEENT: Normal NECK: No JVD; No carotid bruits LYMPHATICS: No lymphadenopathy CARDIAC: S1  S2 regular, 2/6 systolic murmur at the apex. RESPIRATORY:  Clear to auscultation without rales, wheezing or rhonchi  ABDOMEN: Soft, non-tender, non-distended MUSCULOSKELETAL:  No edema; No deformity  SKIN: Warm and dry NEUROLOGIC:  Alert and oriented x 3 PSYCHIATRIC:  Normal affect    Signed, Jenean Lindau, MD  07/01/2018 4:07 PM    [facility]

## 2018-07-02 DIAGNOSIS — J918 Pleural effusion in other conditions classified elsewhere: Secondary | ICD-10-CM | POA: Diagnosis not present

## 2018-07-02 DIAGNOSIS — M810 Age-related osteoporosis without current pathological fracture: Secondary | ICD-10-CM | POA: Diagnosis not present

## 2018-07-02 DIAGNOSIS — R1310 Dysphagia, unspecified: Secondary | ICD-10-CM | POA: Diagnosis not present

## 2018-07-02 DIAGNOSIS — I110 Hypertensive heart disease with heart failure: Secondary | ICD-10-CM | POA: Diagnosis not present

## 2018-07-02 DIAGNOSIS — D649 Anemia, unspecified: Secondary | ICD-10-CM | POA: Diagnosis not present

## 2018-07-02 DIAGNOSIS — I5033 Acute on chronic diastolic (congestive) heart failure: Secondary | ICD-10-CM | POA: Diagnosis not present

## 2018-07-02 DIAGNOSIS — J189 Pneumonia, unspecified organism: Secondary | ICD-10-CM | POA: Diagnosis not present

## 2018-07-04 ENCOUNTER — Other Ambulatory Visit: Payer: Self-pay

## 2018-07-04 ENCOUNTER — Observation Stay (HOSPITAL_COMMUNITY)
Admission: EM | Admit: 2018-07-04 | Discharge: 2018-07-05 | Disposition: A | Discharge status: Home / Self Care | Payer: Medicare HMO | Attending: Internal Medicine | Admitting: Internal Medicine

## 2018-07-04 ENCOUNTER — Observation Stay (HOSPITAL_COMMUNITY): Payer: Medicare HMO

## 2018-07-04 ENCOUNTER — Encounter (HOSPITAL_COMMUNITY): Payer: Self-pay | Admitting: Emergency Medicine

## 2018-07-04 ENCOUNTER — Emergency Department (HOSPITAL_COMMUNITY): Payer: Medicare HMO

## 2018-07-04 DIAGNOSIS — I110 Hypertensive heart disease with heart failure: Secondary | ICD-10-CM | POA: Insufficient documentation

## 2018-07-04 DIAGNOSIS — E876 Hypokalemia: Secondary | ICD-10-CM | POA: Diagnosis not present

## 2018-07-04 DIAGNOSIS — Z90710 Acquired absence of both cervix and uterus: Secondary | ICD-10-CM | POA: Diagnosis not present

## 2018-07-04 DIAGNOSIS — Z79899 Other long term (current) drug therapy: Secondary | ICD-10-CM | POA: Insufficient documentation

## 2018-07-04 DIAGNOSIS — R0600 Dyspnea, unspecified: Secondary | ICD-10-CM | POA: Diagnosis not present

## 2018-07-04 DIAGNOSIS — I700 Atherosclerosis of aorta: Secondary | ICD-10-CM | POA: Insufficient documentation

## 2018-07-04 DIAGNOSIS — Z809 Family history of malignant neoplasm, unspecified: Secondary | ICD-10-CM | POA: Insufficient documentation

## 2018-07-04 DIAGNOSIS — I5033 Acute on chronic diastolic (congestive) heart failure: Secondary | ICD-10-CM | POA: Diagnosis not present

## 2018-07-04 DIAGNOSIS — J910 Malignant pleural effusion: Secondary | ICD-10-CM | POA: Diagnosis not present

## 2018-07-04 DIAGNOSIS — R1310 Dysphagia, unspecified: Secondary | ICD-10-CM | POA: Insufficient documentation

## 2018-07-04 DIAGNOSIS — D649 Anemia, unspecified: Secondary | ICD-10-CM | POA: Diagnosis not present

## 2018-07-04 DIAGNOSIS — J90 Pleural effusion, not elsewhere classified: Secondary | ICD-10-CM | POA: Diagnosis not present

## 2018-07-04 DIAGNOSIS — K5730 Diverticulosis of large intestine without perforation or abscess without bleeding: Secondary | ICD-10-CM | POA: Insufficient documentation

## 2018-07-04 DIAGNOSIS — E875 Hyperkalemia: Secondary | ICD-10-CM | POA: Diagnosis present

## 2018-07-04 DIAGNOSIS — D509 Iron deficiency anemia, unspecified: Secondary | ICD-10-CM | POA: Diagnosis not present

## 2018-07-04 DIAGNOSIS — J9811 Atelectasis: Secondary | ICD-10-CM | POA: Diagnosis not present

## 2018-07-04 DIAGNOSIS — R0902 Hypoxemia: Secondary | ICD-10-CM | POA: Diagnosis not present

## 2018-07-04 DIAGNOSIS — J189 Pneumonia, unspecified organism: Secondary | ICD-10-CM | POA: Diagnosis not present

## 2018-07-04 DIAGNOSIS — J918 Pleural effusion in other conditions classified elsewhere: Secondary | ICD-10-CM | POA: Diagnosis not present

## 2018-07-04 DIAGNOSIS — I10 Essential (primary) hypertension: Secondary | ICD-10-CM | POA: Diagnosis present

## 2018-07-04 DIAGNOSIS — R0602 Shortness of breath: Secondary | ICD-10-CM | POA: Diagnosis not present

## 2018-07-04 DIAGNOSIS — M810 Age-related osteoporosis without current pathological fracture: Secondary | ICD-10-CM | POA: Diagnosis not present

## 2018-07-04 LAB — CBC
HCT: 39 % (ref 36.0–46.0)
Hemoglobin: 12.2 g/dL (ref 12.0–15.0)
MCH: 23.6 pg — ABNORMAL LOW (ref 26.0–34.0)
MCHC: 31.3 g/dL (ref 30.0–36.0)
MCV: 75.4 fL — ABNORMAL LOW (ref 80.0–100.0)
Platelets: 261 10*3/uL (ref 150–400)
RBC: 5.17 MIL/uL — AB (ref 3.87–5.11)
RDW: 13.7 % (ref 11.5–15.5)
WBC: 6.3 10*3/uL (ref 4.0–10.5)
nRBC: 0 % (ref 0.0–0.2)

## 2018-07-04 LAB — BASIC METABOLIC PANEL
Anion gap: 12 (ref 5–15)
BUN: 16 mg/dL (ref 8–23)
CO2: 24 mmol/L (ref 22–32)
Calcium: 8.9 mg/dL (ref 8.9–10.3)
Chloride: 98 mmol/L (ref 98–111)
Creatinine, Ser: 1.01 mg/dL — ABNORMAL HIGH (ref 0.44–1.00)
GFR calc Af Amer: 58 mL/min — ABNORMAL LOW (ref >60)
GFR calc non Af Amer: 50 mL/min — ABNORMAL LOW (ref >60)
Glucose, Bld: 125 mg/dL — ABNORMAL HIGH (ref 70–99)
Potassium: 5.9 mmol/L — ABNORMAL HIGH (ref 3.5–5.1)
SODIUM: 134 mmol/L — AB (ref 135–145)

## 2018-07-04 LAB — I-STAT TROPONIN, ED: Troponin i, poc: 0 ng/mL (ref 0.00–0.08)

## 2018-07-04 MED ORDER — POLYVINYL ALCOHOL 1.4 % OP SOLN
1.0000 [drp] | OPHTHALMIC | Status: DC | PRN
  Filled 2018-07-04: qty 15

## 2018-07-04 MED ORDER — FUROSEMIDE 10 MG/ML IJ SOLN
40.0000 mg | Freq: Once | INTRAMUSCULAR | Status: AC
  Administered 2018-07-04: 40 mg via INTRAVENOUS
  Filled 2018-07-04: qty 4

## 2018-07-04 MED ORDER — ALBUTEROL SULFATE (2.5 MG/3ML) 0.083% IN NEBU: 2.5000 mg | INHALATION_SOLUTION | RESPIRATORY_TRACT | Status: DC | PRN

## 2018-07-04 MED ORDER — POTASSIUM CHLORIDE CRYS ER 10 MEQ PO TBCR
10.0000 meq | EXTENDED_RELEASE_TABLET | Freq: Every day | ORAL | Status: DC
  Filled 2018-07-04: qty 1

## 2018-07-04 MED ORDER — ADULT MULTIVITAMIN W/MINERALS CH
1.0000 | ORAL_TABLET | Freq: Every day | ORAL | Status: DC
  Administered 2018-07-05: 1 via ORAL
  Filled 2018-07-04 (×2): qty 1

## 2018-07-04 MED ORDER — FUROSEMIDE 40 MG PO TABS
40.0000 mg | ORAL_TABLET | Freq: Every day | ORAL | Status: DC
  Administered 2018-07-05: 40 mg via ORAL
  Filled 2018-07-04: qty 1

## 2018-07-04 MED ORDER — SODIUM POLYSTYRENE SULFONATE 15 GM/60ML PO SUSP
45.0000 g | Freq: Once | ORAL | Status: AC
  Administered 2018-07-04: 45 g via ORAL
  Filled 2018-07-04: qty 180

## 2018-07-04 MED ORDER — CARBOXYMETHYLCELLULOSE SODIUM 0.5 % OP SOLN: 1.0000 [drp] | OPHTHALMIC | Status: DC | PRN

## 2018-07-04 MED ORDER — FERROUS SULFATE 325 (65 FE) MG PO TABS: 325.0000 mg | ORAL_TABLET | Freq: Two times a day (BID) | ORAL | Status: DC

## 2018-07-04 MED ORDER — DOCUSATE SODIUM 100 MG PO CAPS: 200.0000 mg | ORAL_CAPSULE | Freq: Two times a day (BID) | ORAL | Status: DC | PRN

## 2018-07-04 MED ORDER — SODIUM CHLORIDE 0.9% FLUSH
3.0000 mL | Freq: Once | INTRAVENOUS | Status: AC
  Administered 2018-07-04: 3 mL via INTRAVENOUS

## 2018-07-04 MED ORDER — CARVEDILOL 3.125 MG PO TABS
3.1250 mg | ORAL_TABLET | Freq: Two times a day (BID) | ORAL | Status: DC
  Administered 2018-07-04 – 2018-07-05 (×2): 3.125 mg via ORAL
  Filled 2018-07-04 (×2): qty 1

## 2018-07-04 MED ORDER — IOHEXOL 300 MG/ML  SOLN
100.0000 mL | Freq: Once | INTRAMUSCULAR | Status: AC | PRN
  Administered 2018-07-04: 100 mL via INTRAVENOUS

## 2018-07-04 NOTE — ED Notes (Signed)
Patient transported to CT 

## 2018-07-04 NOTE — H&P (Signed)
TRH H&P   Patient Demographics:    Ann Cline, is a 85 y.o. female  MRN: [id number]   DOB - 1928-06-09  Admit Date - 07/04/2018  Outpatient Primary MD for the patient is Philmore Pali, NP  Referring MD/NP/PA: Dr Gilford Raid  Outpatient Specialists: Oncology Dr. Anabel Bene in [place]  Patient coming from: Home  Chief Complaint  Patient presents with  . Shortness of Breath      HPI:    Ann Cline  is a 85 y.o. female, past medical history recent hospitalization secondary to dyspnea, diagnosed with malignant pleural effusion that is post thoracentesis pleural effusion during hospital stay, she had left pleural effusion tapped 06/17/2018 during hospital stay, right pleural effusion.as an outpatient oncologist in [place] Dr. Anabel Bene 06/27/2018. Patient presents with shortness of breath, report progressive, mainly on exertion, in ED she was not hypoxic, she placed care with Dr. Anabel Bene, with plan for the abdomen pelvis with IV contrast tomorrow to evaluate for primary origin of her carcinoma and pleural effusion as CT chest during previous hospital stay with no evidence of pulmonary malignancy . - in ED it was not hypoxic, chest x-ray showing recurrence of her pleural effusion, potassium was 5.9, creatinine stable at 1.01, I was called to admit given her dyspnea    Review of systems:    In addition to the HPI above, No Fever-chills, or generalized weakness No Headache, No changes with Vision or hearing, No problems swallowing food or Liquids, No Chest pain, Cough, reports dyspnea No Abdominal pain, No Nausea or Vommitting, Bowel movements are regular, No Blood in stool or Urine, No dysuria, No new skin rashes or bruises, No new joints pains-aches,  No new weakness, tingling, numbness in any extremity, No recent weight gain or loss, No polyuria, polydypsia or polyphagia, No  significant Mental Stressors.     With Past History of the following :    No past medical history on file.    Past Surgical History:  Procedure Laterality Date  . ABDOMINAL HYSTERECTOMY  1975  . IR THORACENTESIS ASP PLEURAL SPACE W/IMG GUIDE  06/17/2018      Social History:     Social History   Tobacco Use  . Smoking status: Never Smoker  . Smokeless tobacco: Never Used  Substance Use Topics  . Alcohol use: Never    Frequency: Never     Lives -at home  Mobility -with assistance    Family History :     Family History  Problem Relation Age of Onset  . Cancer Sister   . Cancer Brother       Home Medications:   Prior to Admission medications   Medication Sig Start Date End Date Taking? Authorizing Provider  acetaminophen (TYLENOL) 500 MG tablet Take 1,000 mg by mouth every 6 (six) hours as needed for mild pain or headache.   Yes [provider]  alendronate (  FOSAMAX) 70 MG tablet Take 70 mg by mouth every Tuesday.  05/15/18  Yes [provider]  carboxymethylcellulose (REFRESH PLUS) 0.5 % SOLN Place 1 drop into both eyes as needed (for dry eyes).    Yes [provider]  carvedilol (COREG) 3.125 MG tablet Take 1 tablet (3.125 mg total) by mouth 2 (two) times daily with a meal. 07/01/18  Yes Revankar, Reita Cliche, MD  docusate sodium (COLACE) 100 MG capsule Take 2 capsules (200 mg total) by mouth 2 (two) times daily as needed for mild constipation. 06/20/18  Yes Thurnell Lose, MD  ferrous sulfate 325 (65 FE) MG tablet Take 1 tablet (325 mg total) by mouth 2 (two) times daily with a meal. 06/20/18  Yes Thurnell Lose, MD  furosemide (LASIX) 40 MG tablet Take 1 tablet (40 mg total) by mouth daily. 06/20/18  Yes Thurnell Lose, MD  Multiple Vitamin (MULTIVITAMIN) capsule Take 1 capsule by mouth daily.   Yes [provider]  potassium chloride (K-DUR) 10 MEQ tablet Take 1 tablet (10 mEq total) by mouth daily. 06/20/18  Yes Thurnell Lose,  MD     Allergies:    No Known Allergies   Physical Exam:   Vitals  Blood pressure (!) 143/86, pulse 81, resp. rate (!) 25, SpO2 98 %.   1. General frail elderly female, laying in bed in no apparent distress  2. Normal affect and insight, Not Suicidal or Homicidal, Awake Alert, Oriented X 3.  3. No F.N deficits, ALL C.Nerves Intact, Strength 5/5 all 4 extremities, Sensation intact all 4 extremities, Plantars down going.  4. Ears and Eyes appear Normal, Conjunctivae clear, PERRLA. Moist Oral Mucosa.  5. Supple Neck, No JVD, No cervical lymphadenopathy appriciated, No Carotid Bruits.  6. Symmetrical Chest wall movement, Good air movement bilaterally, Minister entry at the bases  7. RRR, No Gallops, Rubs or Murmurs, No Parasternal Heave.  8. Positive Bowel Sounds, Abdomen Soft, No tenderness, No organomegaly appriciated,No rebound -guarding or rigidity.  9.  No Cyanosis, Normal Skin Turgor, No Skin Rash or Bruise.  10. Good muscle tone,  joints appear normal , no effusions, Normal ROM.  11. No Palpable Lymph Nodes in Neck or Axillae    Data Review:    CBC Recent Labs  Lab 07/04/18 1457  WBC 6.3  HGB 12.2  HCT 39.0  PLT 261  MCV 75.4*  MCH 23.6*  MCHC 31.3  RDW 13.7   ------------------------------------------------------------------------------------------------------------------  Chemistries  Recent Labs  Lab 07/04/18 1457  NA 134*  K 5.9*  CL 98  CO2 24  GLUCOSE 125*  BUN 16  CREATININE 1.01*  CALCIUM 8.9   ------------------------------------------------------------------------------------------------------------------ estimated creatinine clearance is 32.7 mL/min (A) (by C-G formula based on SCr of 1.01 mg/dL (H)). ------------------------------------------------------------------------------------------------------------------ No results for input(s): TSH, T4TOTAL, T3FREE, THYROIDAB in the last 72 hours.  Invalid input(s): FREET3  Coagulation  profile No results for input(s): INR, PROTIME in the last 168 hours. ------------------------------------------------------------------------------------------------------------------- No results for input(s): DDIMER in the last 72 hours. -------------------------------------------------------------------------------------------------------------------  Cardiac Enzymes No results for input(s): CKMB, TROPONINI, MYOGLOBIN in the last 168 hours.  Invalid input(s): CK ------------------------------------------------------------------------------------------------------------------    Component Value Date/Time   BNP 18.0 06/15/2018 1245     ---------------------------------------------------------------------------------------------------------------  Urinalysis No results found for: COLORURINE, APPEARANCEUR, LABSPEC, [place], GLUCOSEU, HGBUR, BILIRUBINUR, KETONESUR, PROTEINUR, UROBILINOGEN, NITRITE, LEUKOCYTESUR  ----------------------------------------------------------------------------------------------------------------   Imaging Results:    Dg Chest 2 View  Result Date: 07/04/2018 CLINICAL DATA:  85 year old female with  a history of shortness of breath EXAM: CHEST - 2 VIEW COMPARISON:  06/27/2018, 06/26/2018, 06/19/2018 FINDINGS: Cardiomediastinal silhouette unchanged in size and contour, partially obscured by overlying lung and pleural disease. Worsening opacities at the lung bases obscuring the heart borders and hemidiaphragms. Meniscus on the lateral view. Thickening of the fissures. No pneumothorax. Coarsened interstitial markings. No displaced fracture. Scoliotic curvature. IMPRESSION: Increasing opacities at the lung bases, secondary to bilateral pleural effusions with associated atelectasis/consolidation. Mild edema not excluded. Electronically Signed   By: Corrie Mckusick D.O.   On: 07/04/2018 15:23    My personal review of EKG: Rhythm NSR, Rate  76 /min, QTc 400   Assessment  & Plan:    Active Problems:   Dyspnea   Malignant pleural effusion   Dyspnea -Secondary to recurrent pleural effusion, with some atelectasis, see discussion below regarding mild pleural effusion, she is not hypoxic in ED, courage to use incentive spirometry, will reassess tomorrow to see if she qualifies for oxygen before discharge  Malignant pleural effusion -Appears to have recurrence, she is following with Dr. Hinton Rao in [place], plan was for CT abdomen and pelvis with IV contrast tomorrow, she will be hospitalized then, I will obtain during hospital stay so do not delay her oncology care after discharge. -We will request IR to perform thoracentesis again tomorrow for symptomatic relief(they can assess the larger pleural effusion and drain it)  Hyperkalemia -Given IV Lasix in ED, I will give Kayexalate and repeat BMP in a.m.   DVT ProphylaxisSCDs   AM Labs Ordered, also please review Full Orders  Family Communication: Admission, patients condition and plan of care including tests being ordered have been discussed with the patient  who indicate understanding and agree with the plan and Code Status.  Code Status Full  Likely DC to  Home  Condition GUARDED    Consults called: none  Admission status: Observation  Time spent in minutes : 55 minutes   Phillips Climes M.D on 07/04/2018 at 7:10 PM  Between 7am to 7pm - Pager - [phone]. After 7pm go to [email or web address] - password [email or web address]  [facility] - Office  [phone]

## 2018-07-04 NOTE — ED Notes (Signed)
Pt ambulated steady without assistance, oxygen maintained 94-92% on room air while ambulating but dropped down to 89% once back in the bed. 2L [place] applied

## 2018-07-04 NOTE — ED Provider Notes (Addendum)
[facility] EMERGENCY DEPARTMENT Provider Note   CSN: [id number] Arrival date & time: 07/04/18  1431    History   Chief Complaint Chief Complaint  Patient presents with  . Shortness of Breath    HPI Ann Cline is a 85 y.o. female.     Pt presents to the ED today with SOB.  Pt has a hx of CHF with bilateral pleural effusions.  Right lung tapped on 2/13.  Pt said she felt better after fluid was removed, but is feeling sob with any exertion.  Pt denies f/c.  No n/v.  Pt and daughter did not tell me this, but on chart review it looks like the effusion was malignant.  She was to f/u with Dr. Julien Nordmann as an outpatient, but has not had a chance to do this yet.       No past medical history on file.  Patient Active Problem List   Diagnosis Date Noted  . CHF (congestive heart failure) ([place]) 07/01/2018  . HCAP (healthcare-associated pneumonia) 06/16/2018  . Pneumonia 06/15/2018  . Hyperglycemia 06/15/2018  . Trigger finger of right thumb 09/22/2016    Past Surgical History:  Procedure Laterality Date  . ABDOMINAL HYSTERECTOMY  1975  . IR THORACENTESIS ASP PLEURAL SPACE W/IMG GUIDE  06/17/2018     OB History   No obstetric history on file.      Home Medications    Prior to Admission medications   Medication Sig Start Date End Date Taking? Authorizing Provider  acetaminophen (TYLENOL) 500 MG tablet Take 1,000 mg by mouth every 6 (six) hours as needed for mild pain or headache.   Yes [provider]  alendronate (FOSAMAX) 70 MG tablet Take 70 mg by mouth every Tuesday.  05/15/18  Yes [provider]  carboxymethylcellulose (REFRESH PLUS) 0.5 % SOLN Place 1 drop into both eyes as needed (for dry eyes).    Yes [provider]  carvedilol (COREG) 3.125 MG tablet Take 1 tablet (3.125 mg total) by mouth 2 (two) times daily with a meal. 07/01/18  Yes Revankar, Reita Cliche, MD  docusate sodium (COLACE) 100 MG capsule Take 2 capsules (200 mg  total) by mouth 2 (two) times daily as needed for mild constipation. 06/20/18  Yes Thurnell Lose, MD  ferrous sulfate 325 (65 FE) MG tablet Take 1 tablet (325 mg total) by mouth 2 (two) times daily with a meal. 06/20/18  Yes Thurnell Lose, MD  furosemide (LASIX) 40 MG tablet Take 1 tablet (40 mg total) by mouth daily. 06/20/18  Yes Thurnell Lose, MD  Multiple Vitamin (MULTIVITAMIN) capsule Take 1 capsule by mouth daily.   Yes [provider]  potassium chloride (K-DUR) 10 MEQ tablet Take 1 tablet (10 mEq total) by mouth daily. 06/20/18  Yes Thurnell Lose, MD    Family History Family History  Problem Relation Age of Onset  . Cancer Sister   . Cancer Brother     Social History Social History   Tobacco Use  . Smoking status: Never Smoker  . Smokeless tobacco: Never Used  Substance Use Topics  . Alcohol use: Never    Frequency: Never  . Drug use: Never     Allergies   Patient has no known allergies.   Review of Systems Review of Systems  Respiratory: Positive for shortness of breath.   All other systems reviewed and are negative.    Physical Exam Updated Vital Signs BP 124/73   Pulse  73   Resp (!) 21   SpO2 95%   Physical Exam Vitals signs and nursing note reviewed.  Constitutional:      Appearance: She is well-developed.  HENT:     Head: Normocephalic and atraumatic.     Mouth/Throat:     Mouth: Mucous membranes are moist.     Pharynx: Oropharynx is clear.  Eyes:     Extraocular Movements: Extraocular movements intact.     Pupils: Pupils are equal, round, and reactive to light.  Neck:     Musculoskeletal: Normal range of motion and neck supple.  Cardiovascular:     Rate and Rhythm: Normal rate and regular rhythm.  Pulmonary:     Effort: Tachypnea present.  Abdominal:     General: Bowel sounds are normal.     Palpations: Abdomen is soft.  Musculoskeletal: Normal range of motion.  Skin:    General: Skin is warm.     Capillary Refill:  Capillary refill takes less than 2 seconds.  Neurological:     General: No focal deficit present.     Mental Status: She is alert and oriented to person, place, and time.  Psychiatric:        Mood and Affect: Mood normal.        Behavior: Behavior normal.      ED Treatments / Results  Labs (all labs ordered are listed, but only abnormal results are displayed) Labs Reviewed  BASIC METABOLIC PANEL - Abnormal; Notable for the following components:      Result Value   Sodium 134 (*)    Potassium 5.9 (*)    Glucose, Bld 125 (*)    Creatinine, Ser 1.01 (*)    GFR calc non Af Amer 50 (*)    GFR calc Af Amer 58 (*)    All other components within normal limits  CBC - Abnormal; Notable for the following components:   RBC 5.17 (*)    MCV 75.4 (*)    MCH 23.6 (*)    All other components within normal limits  I-STAT TROPONIN, ED    EKG EKG Interpretation  Date/Time:  Thursday July 04 2018 14:35:15 EST Ventricular Rate:  76 PR Interval:  170 QRS Duration: 84 QT Interval:  356 QTC Calculation: 400 R Axis:   4 Text Interpretation:  Normal sinus rhythm Septal infarct , age undetermined Abnormal ECG No significant change since last tracing Confirmed by Isla Pence [phone]) on 07/04/2018 5:21:21 PM   Radiology Dg Chest 2 View  Result Date: 07/04/2018 CLINICAL DATA:  85 year old female with a history of shortness of breath EXAM: CHEST - 2 VIEW COMPARISON:  06/27/2018, 06/26/2018, 06/19/2018 FINDINGS: Cardiomediastinal silhouette unchanged in size and contour, partially obscured by overlying lung and pleural disease. Worsening opacities at the lung bases obscuring the heart borders and hemidiaphragms. Meniscus on the lateral view. Thickening of the fissures. No pneumothorax. Coarsened interstitial markings. No displaced fracture. Scoliotic curvature. IMPRESSION: Increasing opacities at the lung bases, secondary to bilateral pleural effusions with associated atelectasis/consolidation.  Mild edema not excluded. Electronically Signed   By: Corrie Mckusick D.O.   On: 07/04/2018 15:23    Procedures Procedures (including critical care time)  Medications Ordered in ED Medications  sodium chloride flush (NS) 0.9 % injection 3 mL (3 mLs Intravenous Given 07/04/18 1730)  furosemide (LASIX) injection 40 mg (40 mg Intravenous Given 07/04/18 1730)     Initial Impression / Assessment and Plan / ED Course  I have reviewed the triage vital  signs and the nursing notes.  Pertinent labs & imaging results that were available during my care of the patient were reviewed by me and considered in my medical decision making (see chart for details).       Pt's pleural effusions have increased since 2/13.  With ambulation O2 sats drop to 89% and she becomes very sob.  Pt given lasix in ED and placed on 2L oxygen.  Potassium is slightly high.  Pt has been taking oral potassium.  Pt d/w Dr. [facility] ([facility]) for admission.  Final Clinical Impressions(s) / ED Diagnoses   Final diagnoses:  Bilateral pleural effusion  Acute on chronic diastolic congestive heart failure (HCC)  Malignant pleural effusion  Hypoxia  Hyperkalemia    ED Discharge Orders    None       Isla Pence, MD 07/04/18 1737    Isla Pence, MD 07/04/18 1745

## 2018-07-04 NOTE — ED Triage Notes (Signed)
Pt to ER for evaluation of progressively worsening shortness of breath since Sunday. Had thoracentesis last Thursday. Hx of CHF. Left lower lobe diminished compared to right.

## 2018-07-05 ENCOUNTER — Observation Stay (HOSPITAL_COMMUNITY): Payer: Medicare HMO

## 2018-07-05 ENCOUNTER — Encounter (HOSPITAL_COMMUNITY): Payer: Self-pay | Admitting: Student

## 2018-07-05 DIAGNOSIS — R0600 Dyspnea, unspecified: Secondary | ICD-10-CM | POA: Diagnosis not present

## 2018-07-05 DIAGNOSIS — D509 Iron deficiency anemia, unspecified: Secondary | ICD-10-CM

## 2018-07-05 DIAGNOSIS — E875 Hyperkalemia: Secondary | ICD-10-CM

## 2018-07-05 DIAGNOSIS — J90 Pleural effusion, not elsewhere classified: Secondary | ICD-10-CM | POA: Diagnosis not present

## 2018-07-05 DIAGNOSIS — J910 Malignant pleural effusion: Secondary | ICD-10-CM | POA: Diagnosis not present

## 2018-07-05 DIAGNOSIS — I5033 Acute on chronic diastolic (congestive) heart failure: Secondary | ICD-10-CM

## 2018-07-05 HISTORY — PX: IR THORACENTESIS ASP PLEURAL SPACE W/IMG GUIDE: IMG5380

## 2018-07-05 LAB — BASIC METABOLIC PANEL
Anion gap: 11 (ref 5–15)
BUN: 16 mg/dL (ref 8–23)
CO2: 27 mmol/L (ref 22–32)
Calcium: 8.4 mg/dL — ABNORMAL LOW (ref 8.9–10.3)
Chloride: 99 mmol/L (ref 98–111)
Creatinine, Ser: 0.91 mg/dL (ref 0.44–1.00)
GFR calc Af Amer: >60 mL/min (ref >60)
GFR calc non Af Amer: 57 mL/min — ABNORMAL LOW (ref >60)
GLUCOSE: 138 mg/dL — AB (ref 70–99)
Potassium: 3.6 mmol/L (ref 3.5–5.1)
Sodium: 137 mmol/L (ref 135–145)

## 2018-07-05 LAB — CBC
HCT: 37.2 % (ref 36.0–46.0)
Hemoglobin: 11.9 g/dL — ABNORMAL LOW (ref 12.0–15.0)
MCH: 23.5 pg — ABNORMAL LOW (ref 26.0–34.0)
MCHC: 32 g/dL (ref 30.0–36.0)
MCV: 73.5 fL — ABNORMAL LOW (ref 80.0–100.0)
Platelets: 249 10*3/uL (ref 150–400)
RBC: 5.06 MIL/uL (ref 3.87–5.11)
RDW: 13.6 % (ref 11.5–15.5)
WBC: 6.5 10*3/uL (ref 4.0–10.5)
nRBC: 0 % (ref 0.0–0.2)

## 2018-07-05 MED ORDER — LIDOCAINE HCL 1 % IJ SOLN
INTRAMUSCULAR | Status: DC | PRN
  Administered 2018-07-05: 10 mL

## 2018-07-05 MED ORDER — LIDOCAINE HCL 1 % IJ SOLN
INTRAMUSCULAR | Status: AC
  Filled 2018-07-05: qty 20

## 2018-07-05 NOTE — Procedures (Addendum)
PROCEDURE SUMMARY:  Successful US guided therapeutic right thoracentesis. Yielded 550 mL of amber fluid. Pt tolerated procedure well, however stopped prior to removal of all fluid due to patient's pain. No immediate complications.  Specimen was not sent for labs. CXR ordered.  EBL < 5 mL  Docia Barrier PA-C 07/05/2018 10:40 AM

## 2018-07-07 DIAGNOSIS — E875 Hyperkalemia: Secondary | ICD-10-CM | POA: Diagnosis present

## 2018-07-07 DIAGNOSIS — D509 Iron deficiency anemia, unspecified: Secondary | ICD-10-CM | POA: Diagnosis present

## 2018-07-07 DIAGNOSIS — I10 Essential (primary) hypertension: Secondary | ICD-10-CM | POA: Diagnosis present

## 2018-07-07 NOTE — Discharge Summary (Addendum)
Ann Cline, is a 85 y.o. female  DOB 1928/11/12  MRN [id number].  Admission date:  07/04/2018  Admitting Physician  Albertine Patricia, MD  Discharge Date:  07/05/2018   Primary MD  Philmore Pali, NP  Recommendations for primary care physician for things to follow:   -Electrolytes -Plan for management of recurrent pleural effusion   Discharge Diagnosis    Principal Problem:   Malignant pleural effusion Active Problems:   Dyspnea   Hyperkalemia   Essential hypertension   Microcytic anemia      History reviewed. No pertinent past medical history.  Past Surgical History:  Procedure Laterality Date  . ABDOMINAL HYSTERECTOMY  1975  . IR THORACENTESIS ASP PLEURAL SPACE W/IMG GUIDE  06/17/2018  . IR THORACENTESIS ASP PLEURAL SPACE W/IMG GUIDE  07/05/2018       HPI  from the history and physical done on the day of admission:    Ann Cline  is a 85 y.o. female, past medical history recent hospitalization secondary to dyspnea, diagnosed with malignant pleural effusion that is post thoracentesis pleural effusion during hospital stay, she had left pleural effusion tapped 06/17/2018 during hospital stay, right pleural effusion.as an outpatient oncologist in [place] Dr. Anabel Bene 06/27/2018. Patient presents with shortness of breath, report progressive, mainly on exertion, in ED she was not hypoxic, she placed care with Dr. Anabel Bene, with plan for the abdomen pelvis with IV contrast tomorrow to evaluate for primary origin of her carcinoma and pleural effusion as CT chest during previous hospital stay with no evidence of pulmonary malignancy . - in ED it was not hypoxic, chest x-ray showing recurrence of her pleural effusion, potassium was 5.9, creatinine stable at 1.01, I was called to admit given her dyspnea    Hospital  Course:   1. Recurrent malignant pleural effusion, dyspnea: Patient reported worsening shortness of breath, but was never noted to be hypoxic.   Imaging revealed recurrent loculated pleural effusion with secondary atelectasis.  She was encouraged to use incentive spirometry.  Patient underwent therapeutic thoracentesis on 2/21 by IR with removal of approximately 550 mL of fluid.  Review of records shows during last hospitalization on 2/3 cytology from thoracentesis revealed malignant cells positive for cytokeratin 7, and faintly positive for cytokeratin 20. Patient advised to follow-up with Dr. Anabel Bene in [place] and discuss options for recurrent pleural effusion and cancer treatment.  May need  evaluation for need of home oxygen at a later point time.  2. Hyperkalemia: Admission potassium noted to be 5.9.  Patient was given Kayexalate, and repeat BMP within normal limits at 3.6 prior to discharge.  Patient was advised to stop taking the potassium supplement until able to follow back up with repeat lab work next week.  3. Dysphagia: Diagnosed during last admission on 2/1.  Continue dysphagia diet 3  4. Essential hypertension: Continued Coreg and furosemide.  Held potassium supplementation due to hyperkalemia on admission.    5.  Microcytic anemia: Hemoglobin stable at 11.9.  Patient continued on ferrous sulfate.  Follow UP  Follow-up Information    Derwood Kaplan, MD Follow up.   Specialty:  Oncology Contact information: [address] [place] [id number] [phone]            Consults obtained: Interventional radiology consultation  Discharge Condition: Fair  Diet and Activity recommendation: See Discharge Instructions below   Discharge Instructions    Discharge instructions   Complete by:  As directed    Please continue outpatient follow-up with Dr. Anabel Bene as previously scheduled for next week.  Would recommend continuing antibiotics to complete  course as previously prescribed by Dr. Anabel Bene.  Please discuss options to possibly allow fluid on the lungs to be drained at home as it has returned within 1 week requiring drainage.  By going to medical records you should be able to obtain a CD of the recent CT scan of her abdomen and pelvis.  Also request information on how to set up mychart.  It is recommended that you have a repeat BMP within 1 week to assess your potassium levels.  Your primary doctor should be able to determine if you should continue taking potassium supplementation.        Discharge Medications     Allergies as of 07/05/2018   No Known Allergies     Medication List    STOP taking these medications   potassium chloride 10 MEQ tablet Commonly known as:  K-DUR     TAKE these medications   acetaminophen 500 MG tablet Commonly known as:  TYLENOL Take 1,000 mg by mouth every 6 (six) hours as needed for mild pain or headache.   alendronate 70 MG tablet Commonly known as:  FOSAMAX Take 70 mg by mouth every Tuesday.   carboxymethylcellulose 0.5 % Soln Commonly known as:  REFRESH PLUS Place 1 drop into both eyes as needed (for dry eyes).   carvedilol 3.125 MG tablet Commonly known as:  COREG Take 1 tablet (3.125 mg total) by mouth 2 (two) times daily with a meal.   docusate sodium 100 MG capsule Commonly known as:  COLACE Take 2 capsules (200 mg total) by mouth 2 (two) times daily as needed for mild constipation.   ferrous sulfate 325 (65 FE) MG tablet Take 1 tablet (325 mg total) by mouth 2 (two) times daily with a meal.   furosemide 40 MG tablet Commonly known as:  LASIX Take 1 tablet (40 mg total) by mouth daily.   multivitamin capsule Take 1 capsule by mouth daily.       Major procedures and Radiology Reports - PLEASE review detailed and final reports for all details, in brief -   Thoracentesis 07/05/2017:  Yielded 550 mL of amber fluid. Pt tolerated procedure well, however stopped prior to  removal of all fluid due to patient's pain. No immediate complications.   Dg Chest 1 View  Result Date: 07/05/2018 CLINICAL DATA:  Patient status post right thoracentesis today. EXAM: CHEST  1 VIEW COMPARISON:  PA and lateral chest 07/04/2018. FINDINGS: Right pleural effusion is decreased after thoracentesis. No pneumothorax. Left effusion is unchanged. Bibasilar airspace disease noted. Heart size is normal. No acute or focal bony abnormality. IMPRESSION: Negative for pneumothorax after right thoracentesis. Bilateral pleural effusions and basilar airspace disease. Right effusion is somewhat smaller. Electronically Signed   By: Inge Rise M.D.   On: 07/05/2018 09:56   Dg Chest 1 View  Result Date: 06/17/2018 CLINICAL DATA:  Left thoracentesis EXAM: CHEST  1 VIEW COMPARISON:  June 15, 2018 FINDINGS: There is a moderate to large right-sided pleural effusion, more prominent the interval. The left-sided pleural effusion persists but may be smaller. No pneumothorax. No change in the cardiomediastinal silhouette. IMPRESSION: 1. Moderate to large right pleural effusion, more prominent the interval. 2. The left pleural effusion is smaller in the interval consistent with thoracentesis. No pneumothorax. 3. No other changes. Electronically Signed   By: Dorise Bullion III M.D   On: 06/17/2018 14:33   Dg Chest 2 View  Result Date: 07/04/2018 CLINICAL DATA:  85 year old female with a history of shortness of breath EXAM: CHEST - 2 VIEW COMPARISON:  06/27/2018, 06/26/2018, 06/19/2018 FINDINGS: Cardiomediastinal silhouette unchanged in size and contour, partially obscured by overlying lung and pleural disease. Worsening opacities at the lung bases obscuring the heart borders and hemidiaphragms. Meniscus on the lateral view. Thickening of the fissures. No pneumothorax. Coarsened interstitial markings. No displaced fracture. Scoliotic curvature. IMPRESSION: Increasing opacities at the lung bases, secondary to  bilateral pleural effusions with associated atelectasis/consolidation. Mild edema not excluded. Electronically Signed   By: Corrie Mckusick D.O.   On: 07/04/2018 15:23   Dg Chest 2 View  Result Date: 06/19/2018 CLINICAL DATA:  Shortness of breath, respiratory failure EXAM: CHEST - 2 VIEW COMPARISON:  06/17/2018 FINDINGS: Cardiac shadow is enlarged but stable. Bilateral pleural effusions and basilar atelectasis/infiltrate is again seen although slightly improved. This may be in part due to patient upright positioning. No pneumothorax is noted. No acute bony abnormality is seen. IMPRESSION: Bibasilar atelectasis/infiltrate with associated effusions right greater than left. The overall appearance is improved from the prior exam although this may be in part positional in nature. Electronically Signed   By: Inez Catalina M.D.   On: 06/19/2018 08:33   Dg Chest 2 View  Result Date: 06/15/2018 CLINICAL DATA:  Short of breath for 3 days. Productive cough for a week. EXAM: CHEST - 2 VIEW COMPARISON:  06/05/2018 FINDINGS: Moderate bilateral pleural effusions, increased in size from prior study. There is lung base opacity med is likely atelectasis. Can not exclude pneumonia. Upper lungs are clear.  No evidence of pulmonary edema. Cardiac silhouette is partly obscured. It is grossly normal in size. No pneumothorax. IMPRESSION: 1. Increased size of the bilateral pleural effusions since the prior study. There is associated lung base opacity consistent with atelectasis, pneumonia or a combination. 2. No evidence of pulmonary edema. Electronically Signed   By: Lajean Manes M.D.   On: 06/15/2018 13:36   Ct Angio Chest Pe W Or Wo Contrast  Result Date: 06/16/2018 CLINICAL DATA:  Shortness of breath. EXAM: CT ANGIOGRAPHY CHEST WITH CONTRAST TECHNIQUE: Multidetector CT imaging of the chest was performed using the standard protocol during bolus administration of intravenous contrast. Multiplanar CT image reconstructions and MIPs  were obtained to evaluate the vascular anatomy. CONTRAST:  188mL ISOVUE-370 IOPAMIDOL (ISOVUE-370) INJECTION 76% COMPARISON:  Chest radiograph, 06/15/2018 FINDINGS: Cardiovascular: There is satisfactory opacification of the pulmonary arteries to the segmental level. There is no evidence of a pulmonary embolism. Heart is normal in size and configuration. No pericardial effusion. No coronary artery calcifications. Great vessels are normal in caliber. No aortic dissection or atherosclerosis. Mediastinum/Nodes: No mediastinal or hilar masses. No enlarged lymph nodes. Trachea and esophagus are unremarkable. There is increased attenuation in the mediastinal and hilar fat consistent with edema. Lungs/Pleura: Large bilateral pleural effusions. Complete atelectasis of both lower lobes. Mild dependent atelectasis noted in the upper lobes and right middle lobe. Minor areas of ground-glass opacity in the  superior aspect of the upper lobes, also likely atelectasis. No convincing pneumonia or pulmonary edema. No lung mass or suspicious nodule. No pneumothorax. Upper Abdomen: No acute abnormality. Musculoskeletal: No fracture or acute finding. No osteoblastic or osteolytic lesions. Review of the MIP images confirms the above findings. IMPRESSION: 1. No evidence of a pulmonary embolism. 2. Large bilateral pleural effusions with complete lower lobe atelectasis bilaterally. Mild dependent atelectasis in the upper lobes and right middle lobe. 3. No convincing pneumonia or pulmonary edema. Electronically Signed   By: Lajean Manes M.D.   On: 06/16/2018 11:42   Ct Abdomen Pelvis W Contrast  Result Date: 07/04/2018 CLINICAL DATA:  Progressively worsening dyspnea since Sunday. EXAM: CT ABDOMEN AND PELVIS WITH CONTRAST TECHNIQUE: Multidetector CT imaging of the abdomen and pelvis was performed using the standard protocol following bolus administration of intravenous contrast. CONTRAST:  142mL OMNIPAQUE IOHEXOL 300 MG/ML  SOLN  COMPARISON:  CXR 07/04/2018 and 06/27/2018 FINDINGS: Lower chest: Interval development of moderate right loculated pleural effusion since the 06/27/2018 post right-sided thoracentesis radiographs. Moderate left pleural effusion is again noted as well. There is bibasilar compressive atelectasis noted in addition to subsegmental atelectasis and mild peribronchial thickening. Hepatobiliary: Nonspecific mild intrahepatic ductal dilatation with in homogeneous attenuation of the liver likely representing perfusion anomaly or geographic infiltration fat. No space-occupying mass is apparent. No displacement of opacified patent vasculature. Pancreas: Hypodense ill-defined pancreatic tail mass extending to the splenic hilum is identified concerning for a pancreatic neoplasm. This measures approximately 3 x 2.9 x 1.8 cm. Spleen: No splenomegaly or mass. Adrenals/Urinary Tract: Normal bilateral adrenal glands, kidneys, ureters and urinary bladder. Stomach/Bowel: Scattered colonic diverticulosis without acute diverticulitis. No bowel obstruction or inflammation. Decompressed stomach. Normal small bowel rotation. Normal appendix. Vascular/Lymphatic: Nonaneurysmal atherosclerotic aorta. Patent branch vessels. No adenopathy. Reproductive: Status post hysterectomy. No adnexal masses. Other: No abdominal wall hernia or abnormality. No abdominopelvic ascites. Musculoskeletal: No aggressive osseous lesions. IMPRESSION: 1. Ill-defined pancreatic tail mass measuring 3 x 2.9 x 1.8 cm concerning for a pancreatic neoplasm. No evidence of metastatic disease. 2. Recurrence of moderate right and stable moderate left loculated pleural effusions with adjacent compressive atelectasis. 3. Colonic diverticulosis without acute diverticulitis. Electronically Signed   By: Ashley Royalty M.D.   On: 07/04/2018 19:14   Ir Thoracentesis Asp Pleural Space W/img Guide  Result Date: 07/05/2018 INDICATION: Patient with history of carcinoma, bilateral pleural  effusions. Request is made for therapeutic thoracentesis. EXAM: ULTRASOUND GUIDED THERAPEUTIC RIGHT THORACENTESIS MEDICATIONS: 10 mL 1% lidocaine COMPLICATIONS: None immediate. PROCEDURE: An ultrasound guided thoracentesis was thoroughly discussed with the patient and questions answered. The benefits, risks, alternatives and complications were also discussed. The patient understands and wishes to proceed with the procedure. Written consent was obtained. Ultrasound was performed to localize and mark an adequate pocket of fluid in the right chest. The area was then prepped and draped in the normal sterile fashion. 1% Lidocaine was used for local anesthesia. Under ultrasound guidance a 6 Fr Safe-T-Centesis catheter was introduced. Thoracentesis was performed. The catheter was removed and a dressing applied. FINDINGS: A total of approximately 550 mL of amber fluid was removed. IMPRESSION: Successful ultrasound guided therapeutic right thoracentesis yielding 550 mL of pleural fluid. Read by: Brynda Greathouse PA-C No pneumothorax on follow-up chest radiograph. Electronically Signed   By: Lucrezia Europe M.D.   On: 07/05/2018 10:45   Ir Thoracentesis Asp Pleural Space W/img Guide  Result Date: 06/17/2018 INDICATION: Patient with bilateral pleural effusions. Request is made for diagnostic and  therapeutic left thoracentesis. EXAM: ULTRASOUND GUIDED DIAGNOSTIC AND THERAPEUTIC LEFT THORACENTESIS MEDICATIONS: 10 mL 1% lidocaine COMPLICATIONS: None immediate. PROCEDURE: An ultrasound guided thoracentesis was thoroughly discussed with the patient and questions answered. The benefits, risks, alternatives and complications were also discussed. The patient understands and wishes to proceed with the procedure. Written consent was obtained. Ultrasound was performed to localize and mark an adequate pocket of fluid in the left chest. The area was then prepped and draped in the normal sterile fashion. 1% Lidocaine was used for local  anesthesia. Under ultrasound guidance a Safe-T-Centesis catheter was introduced. Thoracentesis was performed. The catheter was removed and a dressing applied. FINDINGS: A total of approximately 800 mL of amber fluid was removed. Samples were sent to the laboratory as requested by the clinical team. IMPRESSION: Successful ultrasound guided diagnostic and therapeutic left thoracentesis yielding 800 mL of pleural fluid. Read by: Brynda Greathouse PA-C Electronically Signed   By: Sandi Mariscal M.D.   On: 06/17/2018 14:41    Micro Results   No results found for this or any previous visit (from the past 240 hour(s)).     Today   Subjective    Ann Cline following thoracentesis reports that she can breathe a little bit better.   Objective   Blood pressure 112/64, pulse 88, temperature 98.4 F (36.9 C), temperature source Oral, resp. rate 18, height 5' (1.524 m), weight 60.6 kg, SpO2 96 %.   Exam  Constitutional: Frail elderly female currently NAD, calm, comfortable Eyes: PERRL, lids and conjunctivae normal ENMT: Mucous membranes are moist. Posterior pharynx clear of any exudate or lesions.  Neck: normal, supple, no masses, no thyromegaly Respiratory: Decreased overall air movement with breath sounds minimal over the lower lung fields.  Normal respiratory effort on room air. Cardiovascular: Regular rate and rhythm, no murmurs / rubs / gallops. No extremity edema. 2+ pedal pulses. No carotid bruits.  Abdomen: no tenderness, no masses palpated. No hepatosplenomegaly. Bowel sounds positive.  Musculoskeletal: no clubbing / cyanosis. No joint deformity upper and lower extremities. Good ROM, no contractures. Normal muscle tone.  Skin: no rashes, lesions, ulcers. No induration Neurologic: CN 2-12 grossly intact. Sensation intact, DTR normal. Strength 5/5 in all 4.  Psychiatric: Normal judgment and insight. Alert and oriented x 3. Normal mood.    Data Review   CBC w Diff:  Lab Results    Component Value Date   WBC 6.5 07/05/2018   HGB 11.9 (L) 07/05/2018   HCT 37.2 07/05/2018   PLT 249 07/05/2018   LYMPHOPCT 18 06/16/2018   MONOPCT 4 06/16/2018   EOSPCT 0 06/16/2018   BASOPCT 0 06/16/2018    CMP:  Lab Results  Component Value Date   NA 137 07/05/2018   K 3.6 07/05/2018   CL 99 07/05/2018   CO2 27 07/05/2018   BUN 16 07/05/2018   CREATININE 0.91 07/05/2018   PROT 5.9 (L) 06/17/2018   ALBUMIN 2.9 (L) 06/17/2018   BILITOT 0.7 06/17/2018   ALKPHOS 88 06/17/2018   AST 19 06/17/2018   ALT 14 06/17/2018  .   Total Time in preparing paper work, data evaluation and todays exam - 35 minutes  Norval Morton M.D on 07/07/2018 at 1:50 PM  [facility]   Office  [phone]

## 2018-07-08 DIAGNOSIS — D638 Anemia in other chronic diseases classified elsewhere: Secondary | ICD-10-CM | POA: Diagnosis not present

## 2018-07-08 DIAGNOSIS — J910 Malignant pleural effusion: Secondary | ICD-10-CM | POA: Diagnosis not present

## 2018-07-08 DIAGNOSIS — C259 Malignant neoplasm of pancreas, unspecified: Secondary | ICD-10-CM | POA: Diagnosis not present

## 2018-07-08 DIAGNOSIS — E876 Hypokalemia: Secondary | ICD-10-CM | POA: Diagnosis not present

## 2018-07-08 DIAGNOSIS — D649 Anemia, unspecified: Secondary | ICD-10-CM | POA: Diagnosis not present

## 2018-07-09 DIAGNOSIS — J910 Malignant pleural effusion: Secondary | ICD-10-CM | POA: Diagnosis not present

## 2018-07-09 DIAGNOSIS — R0602 Shortness of breath: Secondary | ICD-10-CM | POA: Diagnosis not present

## 2018-07-09 DIAGNOSIS — E43 Unspecified severe protein-calorie malnutrition: Secondary | ICD-10-CM | POA: Diagnosis not present

## 2018-07-09 DIAGNOSIS — Z7901 Long term (current) use of anticoagulants: Secondary | ICD-10-CM | POA: Diagnosis not present

## 2018-07-09 DIAGNOSIS — K5730 Diverticulosis of large intestine without perforation or abscess without bleeding: Secondary | ICD-10-CM | POA: Diagnosis not present

## 2018-07-09 DIAGNOSIS — I110 Hypertensive heart disease with heart failure: Secondary | ICD-10-CM | POA: Diagnosis not present

## 2018-07-09 DIAGNOSIS — J9383 Other pneumothorax: Secondary | ICD-10-CM | POA: Diagnosis not present

## 2018-07-09 DIAGNOSIS — K8689 Other specified diseases of pancreas: Secondary | ICD-10-CM | POA: Diagnosis not present

## 2018-07-09 DIAGNOSIS — C252 Malignant neoplasm of tail of pancreas: Secondary | ICD-10-CM | POA: Diagnosis not present

## 2018-07-09 DIAGNOSIS — Z7982 Long term (current) use of aspirin: Secondary | ICD-10-CM | POA: Diagnosis not present

## 2018-07-09 DIAGNOSIS — I5031 Acute diastolic (congestive) heart failure: Secondary | ICD-10-CM | POA: Diagnosis not present

## 2018-07-09 DIAGNOSIS — J939 Pneumothorax, unspecified: Secondary | ICD-10-CM | POA: Diagnosis not present

## 2018-07-09 DIAGNOSIS — J9601 Acute respiratory failure with hypoxia: Secondary | ICD-10-CM | POA: Diagnosis not present

## 2018-07-09 DIAGNOSIS — J948 Other specified pleural conditions: Secondary | ICD-10-CM | POA: Diagnosis not present

## 2018-07-09 DIAGNOSIS — C801 Malignant (primary) neoplasm, unspecified: Secondary | ICD-10-CM | POA: Diagnosis not present

## 2018-07-09 DIAGNOSIS — Z978 Presence of other specified devices: Secondary | ICD-10-CM | POA: Diagnosis not present

## 2018-07-09 DIAGNOSIS — E871 Hypo-osmolality and hyponatremia: Secondary | ICD-10-CM | POA: Diagnosis not present

## 2018-07-09 DIAGNOSIS — K869 Disease of pancreas, unspecified: Secondary | ICD-10-CM | POA: Diagnosis not present

## 2018-07-09 DIAGNOSIS — J90 Pleural effusion, not elsewhere classified: Secondary | ICD-10-CM | POA: Diagnosis not present

## 2018-07-09 DIAGNOSIS — R918 Other nonspecific abnormal finding of lung field: Secondary | ICD-10-CM | POA: Diagnosis not present

## 2018-07-09 DIAGNOSIS — I5033 Acute on chronic diastolic (congestive) heart failure: Secondary | ICD-10-CM | POA: Diagnosis not present

## 2018-07-09 DIAGNOSIS — I5032 Chronic diastolic (congestive) heart failure: Secondary | ICD-10-CM | POA: Diagnosis not present

## 2018-07-09 DIAGNOSIS — Z87891 Personal history of nicotine dependence: Secondary | ICD-10-CM | POA: Diagnosis not present

## 2018-07-12 DIAGNOSIS — C801 Malignant (primary) neoplasm, unspecified: Secondary | ICD-10-CM

## 2018-07-12 HISTORY — DX: Malignant (primary) neoplasm, unspecified: C80.1

## 2018-07-16 MED ORDER — FERROUS SULFATE 325 (65 FE) MG PO TABS
325.00 | ORAL_TABLET | ORAL | Status: DC
Start: 2018-07-17 — End: 2018-07-16

## 2018-07-16 MED ORDER — ENOXAPARIN SODIUM 40 MG/0.4ML ~~LOC~~ SOLN
40.00 | SUBCUTANEOUS | Status: DC
Start: 2018-07-16 — End: 2018-07-16

## 2018-07-16 MED ORDER — TRAMADOL HCL 50 MG PO TABS
50.00 | ORAL_TABLET | ORAL | Status: DC
Start: ? — End: 2018-07-16

## 2018-07-16 MED ORDER — FUROSEMIDE 20 MG PO TABS
40.00 | ORAL_TABLET | ORAL | Status: DC
Start: 2018-07-16 — End: 2018-07-16

## 2018-07-16 MED ORDER — POLYETHYLENE GLYCOL 3350 17 G PO PACK
17.00 | PACK | ORAL | Status: DC
Start: 2018-07-16 — End: 2018-07-16

## 2018-07-16 MED ORDER — SENNOSIDES-DOCUSATE SODIUM 8.6-50 MG PO TABS
1.00 | ORAL_TABLET | ORAL | Status: DC
Start: 2018-07-15 — End: 2018-07-16

## 2018-07-16 MED ORDER — ACETAMINOPHEN 325 MG PO TABS
650.00 | ORAL_TABLET | ORAL | Status: DC
Start: ? — End: 2018-07-16

## 2018-07-16 MED ORDER — CARVEDILOL 3.125 MG PO TABS
3.13 | ORAL_TABLET | ORAL | Status: DC
Start: 2018-07-15 — End: 2018-07-16

## 2018-07-17 DIAGNOSIS — D649 Anemia, unspecified: Secondary | ICD-10-CM | POA: Diagnosis not present

## 2018-07-17 DIAGNOSIS — I110 Hypertensive heart disease with heart failure: Secondary | ICD-10-CM | POA: Diagnosis not present

## 2018-07-17 DIAGNOSIS — M810 Age-related osteoporosis without current pathological fracture: Secondary | ICD-10-CM | POA: Diagnosis not present

## 2018-07-17 DIAGNOSIS — R1310 Dysphagia, unspecified: Secondary | ICD-10-CM | POA: Diagnosis not present

## 2018-07-17 DIAGNOSIS — I5033 Acute on chronic diastolic (congestive) heart failure: Secondary | ICD-10-CM | POA: Diagnosis not present

## 2018-07-17 DIAGNOSIS — J189 Pneumonia, unspecified organism: Secondary | ICD-10-CM | POA: Diagnosis not present

## 2018-07-17 DIAGNOSIS — J910 Malignant pleural effusion: Secondary | ICD-10-CM | POA: Diagnosis not present

## 2018-07-17 DIAGNOSIS — J918 Pleural effusion in other conditions classified elsewhere: Secondary | ICD-10-CM | POA: Diagnosis not present

## 2018-07-18 ENCOUNTER — Observation Stay (HOSPITAL_COMMUNITY)
Admission: EM | Admit: 2018-07-18 | Discharge: 2018-07-19 | Disposition: A | Discharge status: Home Health | Payer: Medicare HMO | Attending: Internal Medicine | Admitting: Internal Medicine

## 2018-07-18 ENCOUNTER — Emergency Department (HOSPITAL_COMMUNITY): Payer: Medicare HMO

## 2018-07-18 ENCOUNTER — Other Ambulatory Visit: Payer: Self-pay

## 2018-07-18 ENCOUNTER — Encounter (HOSPITAL_COMMUNITY): Payer: Self-pay

## 2018-07-18 DIAGNOSIS — K869 Disease of pancreas, unspecified: Secondary | ICD-10-CM | POA: Diagnosis not present

## 2018-07-18 DIAGNOSIS — R0602 Shortness of breath: Secondary | ICD-10-CM

## 2018-07-18 DIAGNOSIS — C259 Malignant neoplasm of pancreas, unspecified: Secondary | ICD-10-CM | POA: Diagnosis not present

## 2018-07-18 DIAGNOSIS — I509 Heart failure, unspecified: Secondary | ICD-10-CM

## 2018-07-18 DIAGNOSIS — J910 Malignant pleural effusion: Secondary | ICD-10-CM | POA: Diagnosis present

## 2018-07-18 DIAGNOSIS — D649 Anemia, unspecified: Secondary | ICD-10-CM | POA: Diagnosis present

## 2018-07-18 DIAGNOSIS — Z87891 Personal history of nicotine dependence: Secondary | ICD-10-CM | POA: Diagnosis not present

## 2018-07-18 DIAGNOSIS — J9601 Acute respiratory failure with hypoxia: Secondary | ICD-10-CM | POA: Diagnosis not present

## 2018-07-18 DIAGNOSIS — R1110 Vomiting, unspecified: Secondary | ICD-10-CM | POA: Diagnosis present

## 2018-07-18 DIAGNOSIS — Z79899 Other long term (current) drug therapy: Secondary | ICD-10-CM | POA: Diagnosis not present

## 2018-07-18 DIAGNOSIS — K8689 Other specified diseases of pancreas: Secondary | ICD-10-CM | POA: Diagnosis present

## 2018-07-18 DIAGNOSIS — K5730 Diverticulosis of large intestine without perforation or abscess without bleeding: Secondary | ICD-10-CM | POA: Insufficient documentation

## 2018-07-18 DIAGNOSIS — I252 Old myocardial infarction: Secondary | ICD-10-CM | POA: Insufficient documentation

## 2018-07-18 DIAGNOSIS — I700 Atherosclerosis of aorta: Secondary | ICD-10-CM | POA: Diagnosis not present

## 2018-07-18 HISTORY — DX: Heart failure, unspecified: I50.9

## 2018-07-18 HISTORY — DX: Malignant (primary) neoplasm, unspecified: C80.1

## 2018-07-18 LAB — POCT I-STAT EG7
Acid-Base Excess: 5 mmol/L — ABNORMAL HIGH (ref 0.0–2.0)
Bicarbonate: 28.8 mmol/L — ABNORMAL HIGH (ref 20.0–28.0)
Calcium, Ion: 1.15 mmol/L (ref 1.15–1.40)
HCT: 34 % — ABNORMAL LOW (ref 36.0–46.0)
Hemoglobin: 11.6 g/dL — ABNORMAL LOW (ref 12.0–15.0)
O2 Saturation: 62 %
Potassium: 4.1 mmol/L (ref 3.5–5.1)
SODIUM: 133 mmol/L — AB (ref 135–145)
TCO2: 30 mmol/L (ref 22–32)
pCO2, Ven: 38.7 mmHg — ABNORMAL LOW (ref 44.0–60.0)
pH, Ven: 7.48 — ABNORMAL HIGH (ref 7.250–7.430)
pO2, Ven: 30 mmHg — CL (ref 32.0–45.0)

## 2018-07-18 LAB — TROPONIN I

## 2018-07-18 LAB — BASIC METABOLIC PANEL
Anion gap: 12 (ref 5–15)
BUN: 19 mg/dL (ref 8–23)
CO2: 25 mmol/L (ref 22–32)
Calcium: 8.9 mg/dL (ref 8.9–10.3)
Chloride: 95 mmol/L — ABNORMAL LOW (ref 98–111)
Creatinine, Ser: 1.03 mg/dL — ABNORMAL HIGH (ref 0.44–1.00)
GFR calc Af Amer: 57 mL/min — ABNORMAL LOW (ref >60)
GFR, EST NON AFRICAN AMERICAN: 49 mL/min — AB (ref >60)
Glucose, Bld: 118 mg/dL — ABNORMAL HIGH (ref 70–99)
Potassium: 4.3 mmol/L (ref 3.5–5.1)
Sodium: 132 mmol/L — ABNORMAL LOW (ref 135–145)

## 2018-07-18 LAB — BRAIN NATRIURETIC PEPTIDE: B Natriuretic Peptide: 27.1 pg/mL (ref 0.0–100.0)

## 2018-07-18 LAB — CBC
HCT: 38.6 % (ref 36.0–46.0)
Hemoglobin: 11.8 g/dL — ABNORMAL LOW (ref 12.0–15.0)
MCH: 23 pg — ABNORMAL LOW (ref 26.0–34.0)
MCHC: 30.6 g/dL (ref 30.0–36.0)
MCV: 75.1 fL — ABNORMAL LOW (ref 80.0–100.0)
PLATELETS: 279 10*3/uL (ref 150–400)
RBC: 5.14 MIL/uL — ABNORMAL HIGH (ref 3.87–5.11)
RDW: 13.7 % (ref 11.5–15.5)
WBC: 9 10*3/uL (ref 4.0–10.5)
nRBC: 0 % (ref 0.0–0.2)

## 2018-07-18 MED ORDER — LACTATED RINGERS IV BOLUS
500.0000 mL | Freq: Once | INTRAVENOUS | Status: AC
  Administered 2018-07-18: 500 mL via INTRAVENOUS

## 2018-07-18 MED ORDER — FERROUS SULFATE 325 (65 FE) MG PO TABS
325.0000 mg | ORAL_TABLET | ORAL | Status: DC
  Administered 2018-07-19: 325 mg via ORAL
  Filled 2018-07-18: qty 1

## 2018-07-18 MED ORDER — ONDANSETRON HCL 4 MG/2ML IJ SOLN
4.0000 mg | Freq: Four times a day (QID) | INTRAMUSCULAR | Status: DC | PRN
  Administered 2018-07-19: 4 mg via INTRAVENOUS
  Filled 2018-07-18: qty 2

## 2018-07-18 MED ORDER — ONDANSETRON HCL 4 MG PO TABS: 4.0000 mg | ORAL_TABLET | Freq: Four times a day (QID) | ORAL | Status: DC | PRN

## 2018-07-18 MED ORDER — ACETAMINOPHEN 325 MG PO TABS: 650.0000 mg | ORAL_TABLET | Freq: Four times a day (QID) | ORAL | Status: DC | PRN

## 2018-07-18 MED ORDER — DOCUSATE SODIUM 100 MG PO CAPS: 200.0000 mg | ORAL_CAPSULE | Freq: Two times a day (BID) | ORAL | Status: DC | PRN

## 2018-07-18 MED ORDER — CARVEDILOL 3.125 MG PO TABS
3.1250 mg | ORAL_TABLET | Freq: Two times a day (BID) | ORAL | Status: DC
  Administered 2018-07-19 (×2): 3.125 mg via ORAL
  Filled 2018-07-18 (×2): qty 1

## 2018-07-18 MED ORDER — TRAMADOL HCL 50 MG PO TABS
50.0000 mg | ORAL_TABLET | Freq: Two times a day (BID) | ORAL | Status: DC | PRN
  Administered 2018-07-19: 50 mg via ORAL
  Filled 2018-07-18: qty 1

## 2018-07-18 MED ORDER — ONDANSETRON HCL 4 MG/2ML IJ SOLN
4.0000 mg | Freq: Once | INTRAMUSCULAR | Status: DC
  Filled 2018-07-18: qty 2

## 2018-07-18 MED ORDER — ACETAMINOPHEN 650 MG RE SUPP: 650.0000 mg | Freq: Four times a day (QID) | RECTAL | Status: DC | PRN

## 2018-07-18 NOTE — ED Triage Notes (Addendum)
Pt endorses n/v that began yesterday and also shob that has been going on "for a long time" pt has pleural catheter on right side to drain fluid from lung. Had it drained 381ml yesterday. Pt has hx of CHF and is on fluid pills. Also has recent hx of pancreatic cancer. Axox4. Appears visibly shob.

## 2018-07-18 NOTE — H&P (Signed)
History and Physical    Ann Cline GDJ:[id number] DOB: 09/16/1928 DOA: 07/18/2018  PCP: Philmore Pali, NP  Patient coming from: Home.  Chief Complaint: Shortness of breath.  HPI: Ann Cline is a 85 y.o. female with recently diagnosed malignant pleural effusion with pancreatic mass as follow-up with oncologist at [facility] at [place] this week presents to the ER because of worsening shortness of breath and had 2 episodes of nausea vomiting.  Patient was diagnosed with malignant pleural effusion over the last 4 to 5 weeks ago and has had at least 3 thoracentesis on either side of the chest and was recently admitted last week at [facility] had Pleurx catheter placed.  Pleurx catheter was drained 1 day ago.  This morning patient woke up with acute shortness of breath and 2 episodes of nausea vomiting denies abdominal pain.  Remained tachypneic and was brought to the ER.  ED Course: Chest x-ray shows bilateral moderate pleural effusion.  EKG shows normal sinus rhythm troponin and BNP were unremarkable.  Patient was tachypneic and tachycardic admitted for acute respiratory failure.  Review of Systems: As per HPI, rest all negative.   Past Medical History:  Diagnosis Date  . Cancer ([place]) 07/12/2018   pancreatic cancer  . CHF (congestive heart failure) ([place])     Past Surgical History:  Procedure Laterality Date  . ABDOMINAL HYSTERECTOMY  1975  . IR THORACENTESIS ASP PLEURAL SPACE W/IMG GUIDE  06/17/2018  . IR THORACENTESIS ASP PLEURAL SPACE W/IMG GUIDE  07/05/2018     reports that she has quit smoking. She has never used smokeless tobacco. She reports that she does not drink alcohol or use drugs.  No Known Allergies  Family History  Problem Relation Age of Onset  . Cancer Sister   . Cancer Brother     Prior to Admission medications   Medication Sig Start Date End Date Taking? Authorizing Provider  acetaminophen (TYLENOL) 500 MG tablet Take 1,000 mg by  mouth every 6 (six) hours as needed for mild pain or headache.   Yes [provider]  alendronate (FOSAMAX) 70 MG tablet Take 70 mg by mouth every Tuesday.  05/15/18  Yes [provider]  carboxymethylcellulose (REFRESH PLUS) 0.5 % SOLN Place 1 drop into both eyes as needed (for dry eyes).    Yes [provider]  carvedilol (COREG) 3.125 MG tablet Take 1 tablet (3.125 mg total) by mouth 2 (two) times daily with a meal. 07/01/18  Yes Revankar, Reita Cliche, MD  docusate sodium (COLACE) 100 MG capsule Take 2 capsules (200 mg total) by mouth 2 (two) times daily as needed for mild constipation. 06/20/18  Yes Thurnell Lose, MD  ferrous sulfate 325 (65 FE) MG tablet Take 1 tablet (325 mg total) by mouth 2 (two) times daily with a meal. Patient taking differently: Take 325 mg by mouth every Monday, Wednesday, and Friday.  06/20/18  Yes Thurnell Lose, MD  furosemide (LASIX) 40 MG tablet Take 1 tablet (40 mg total) by mouth daily. 06/20/18  Yes Thurnell Lose, MD  Multiple Vitamin (MULTIVITAMIN) capsule Take 1 capsule by mouth daily.   Yes [provider]  polyethylene glycol powder (GLYCOLAX/MIRALAX) powder Take 17 g by mouth daily as needed for moderate constipation. 07/15/18  Yes [provider]  traMADol (ULTRAM) 50 MG tablet Take 50 mg by mouth 2 (two) times daily as needed for moderate pain. 07/15/18  Yes [provider]  Physical Exam: Vitals:   07/18/18 1930 07/18/18 2000 07/18/18 2052 07/18/18 2105  BP: 110/82 124/67 122/69   Pulse: 76 75 78   Resp: (!) 28 (!) 33 (!) 24   Temp:    98.6 F (37 C)  TempSrc:    Oral  SpO2: 91% 93% 91%   Weight:      Height:          Constitutional: Moderately built and nourished. Vitals:   07/18/18 1930 07/18/18 2000 07/18/18 2052 07/18/18 2105  BP: 110/82 124/67 122/69   Pulse: 76 75 78   Resp: (!) 28 (!) 33 (!) 24   Temp:    98.6 F (37 C)  TempSrc:    Oral  SpO2: 91% 93% 91%   Weight:        Height:       Eyes: Anicteric no pallor. ENMT: No discharge from the ears eyes nose and mouth. Neck: No mass felt.  No neck rigidity. Respiratory: No rhonchi or crepitations. Cardiovascular: S1-S2 heard. Abdomen: Soft nontender bowel sounds present. Musculoskeletal: No edema.  No joint effusion. Skin: No rash. Neurologic: Alert awake oriented to time place and person.  Moves all extremities. Psychiatric: Appears normal.  Normal affect.   Labs on Admission: I have personally reviewed following labs and imaging studies  CBC: Recent Labs  Lab 07/18/18 1622 07/18/18 1820  WBC 9.0  --   HGB 11.8* 11.6*  HCT 38.6 34.0*  MCV 75.1*  --   PLT 279  --    Basic Metabolic Panel: Recent Labs  Lab 07/18/18 1622 07/18/18 1820  NA 132* 133*  K 4.3 4.1  CL 95*  --   CO2 25  --   GLUCOSE 118*  --   BUN 19  --   CREATININE 1.03*  --   CALCIUM 8.9  --    GFR: Estimated Creatinine Clearance: 30.6 mL/min (A) (by C-G formula based on SCr of 1.03 mg/dL (H)). Liver Function Tests: No results for input(s): AST, ALT, ALKPHOS, BILITOT, PROT, ALBUMIN in the last 168 hours. No results for input(s): LIPASE, AMYLASE in the last 168 hours. No results for input(s): AMMONIA in the last 168 hours. Coagulation Profile: No results for input(s): INR, PROTIME in the last 168 hours. Cardiac Enzymes: Recent Labs  Lab 07/18/18 1622  TROPONINI <0.03   BNP (last 3 results) No results for input(s): PROBNP in the last 8760 hours. HbA1C: No results for input(s): HGBA1C in the last 72 hours. CBG: No results for input(s): GLUCAP in the last 168 hours. Lipid Profile: No results for input(s): CHOL, HDL, LDLCALC, TRIG, CHOLHDL, LDLDIRECT in the last 72 hours. Thyroid Function Tests: No results for input(s): TSH, T4TOTAL, FREET4, T3FREE, THYROIDAB in the last 72 hours. Anemia Panel: No results for input(s): VITAMINB12, FOLATE, FERRITIN, TIBC, IRON, RETICCTPCT in the last 72 hours. Urine analysis: No  results found for: COLORURINE, APPEARANCEUR, LABSPEC, PHURINE, GLUCOSEU, HGBUR, BILIRUBINUR, KETONESUR, PROTEINUR, UROBILINOGEN, NITRITE, LEUKOCYTESUR Sepsis Labs: @LABRCNTIP (procalcitonin:4,lacticidven:4) )No results found for this or any previous visit (from the past 240 hour(s)).   Radiological Exams on Admission: Dg Chest 2 View  Result Date: 07/18/2018 CLINICAL DATA:  Shortness of breath EXAM: CHEST - 2 VIEW COMPARISON:  07/05/2018, 07/04/2018, 06/27/2018 FINDINGS: Moderate bilateral pleural effusion without significant interval change. Basilar airspace disease. Probable cardiomegaly. Vascular calcification. Right-sided chest drainage catheter with tip at the right apex. No pneumothorax. IMPRESSION: 1. Right-sided chest drainage catheter with tip positioned over the right apex. 2. Similar size and  appearance of moderate bilateral pleural effusions and basilar airspace disease. 3. Vascular congestion Electronically Signed   By: Donavan Foil M.D.   On: 07/18/2018 17:22    EKG: Independently reviewed.  Normal sinus rhythm.  Assessment/Plan Principal Problem:   Acute respiratory failure with hypoxia (HCC) Active Problems:   CHF (congestive heart failure) (HCC)   Normochromic normocytic anemia    1. Acute respiratory failure with hypoxia secondary to recurrent bilateral pleural effusion has had recent Pleurx catheter placed on the right side.  I discussed with on-call pulmonologist who advised to drain the right-sided Pleurx catheter.  Repeat chest x-ray.  If there is persistent pleural effusion 10 reconsult pulmonologist.  May need Pleurx catheter placed on the left side too. 2. Pancreatic mass with malignant pleural effusion has appointment with oncologist at [facility] this week. 3. Nausea vomiting has improved.  Has poor appetite.  If persist may have to get CT abdomen. 4. Possible aspiration with chest x-ray showing basilar airspace disease for now we will keep patient on  Zosyn. 5. Normocytic normochromic anemia follow CBC. 6. Diastolic dysfunction on Lasix and Coreg.   DVT prophylaxis: SCDs in anticipation of procedure. Code Status: Full code. Family Communication: Patient's daughter. Disposition Plan: Home. Consults called: Discussed with pulmonologist. Admission status: Observation.   Rise Patience MD [facility] Pager [phone].  If 7PM-7AM, please contact night-coverage [email or web address] Password [email or web address]  07/18/2018, 9:50 PM

## 2018-07-18 NOTE — ED Notes (Signed)
Patient transported to X-ray 

## 2018-07-18 NOTE — ED Notes (Signed)
ED TO INPATIENT HANDOFF REPORT  ED Nurse Name and Phone #: Annamary Carolin, [place]  S Name/Age/Gender Ann Cline 85 y.o. female Room/Bed: 015C/015C  Code Status   Code Status: Prior  Home/SNF/Other Home Patient oriented to: self, place, time and situation Is this baseline? Yes   Triage Complete: Triage complete  Chief Complaint SOB  Triage Note Pt endorses n/v that began yesterday and also shob that has been going on "for a long time" pt has pleural catheter on right side to drain fluid from lung. Had it drained 358ml yesterday. Pt has hx of CHF and is on fluid pills. Also has recent hx of pancreatic cancer. Axox4. Appears visibly shob.    Allergies No Known Allergies  Level of Care/Admitting Diagnosis ED Disposition    ED Disposition Condition Comment   Admit  Hospital Area: [place] [[id number]]  Level of Care: Progressive [102]  I expect the patient will be discharged within 24 hours: No (not a candidate for 5C-Observation unit)  Diagnosis: Acute respiratory failure with hypoxia [facility]) [[id number]]  Admitting Physician: Rise Patience [id number]  Attending Physician: Rise Patience [[id number]]  PT Class (Do Not Modify): Observation [104]  PT Acc Code (Do Not Modify): Observation [10022]       B Medical/Surgery History Past Medical History:  Diagnosis Date  . Cancer ([place]) 07/12/2018   pancreatic cancer  . CHF (congestive heart failure) ([place])    Past Surgical History:  Procedure Laterality Date  . ABDOMINAL HYSTERECTOMY  1975  . IR THORACENTESIS ASP PLEURAL SPACE W/IMG GUIDE  06/17/2018  . IR THORACENTESIS ASP PLEURAL SPACE W/IMG GUIDE  07/05/2018     A IV Location/Drains/Wounds Patient Lines/Drains/Airways Status   Active Line/Drains/Airways    Name:   Placement date:   Placement time:   Site:   Days:   Peripheral IV 07/18/18 Left Antecubital   07/18/18    1623    Antecubital   less than 1          Intake/Output Last 24 hours No  intake or output data in the 24 hours ending 07/18/18 1930  Labs/Imaging Results for orders placed or performed during the hospital encounter of 07/18/18 (from the past 48 hour(s))  Basic metabolic panel     Status: Abnormal   Collection Time: 07/18/18  4:22 PM  Result Value Ref Range   Sodium 132 (L) 135 - 145 mmol/L   Potassium 4.3 3.5 - 5.1 mmol/L   Chloride 95 (L) 98 - 111 mmol/L   CO2 25 22 - 32 mmol/L   Glucose, Bld 118 (H) 70 - 99 mg/dL   BUN 19 8 - 23 mg/dL   Creatinine, Ser 1.03 (H) 0.44 - 1.00 mg/dL   Calcium 8.9 8.9 - 10.3 mg/dL   GFR calc non Af Amer 49 (L) >60 mL/min   GFR calc Af Amer 57 (L) >60 mL/min   Anion gap 12 5 - 15    Comment: Performed at [facility], [address], [place], [place] [address]  CBC     Status: Abnormal   Collection Time: 07/18/18  4:22 PM  Result Value Ref Range   WBC 9.0 4.0 - 10.5 K/uL   RBC 5.14 (H) 3.87 - 5.11 MIL/uL   Hemoglobin 11.8 (L) 12.0 - 15.0 g/dL   HCT 38.6 36.0 - 46.0 %   MCV 75.1 (L) 80.0 - 100.0 fL   MCH 23.0 (L) 26.0 - 34.0 pg   MCHC 30.6 30.0 -  36.0 g/dL   RDW 13.7 11.5 - 15.5 %   Platelets 279 150 - 400 K/uL   nRBC 0.0 0.0 - 0.2 %    Comment: Performed at [facility], [place] [address], [place], [place] [address]  Troponin I - ONCE - STAT     Status: None   Collection Time: 07/18/18  4:22 PM  Result Value Ref Range   Troponin I <0.03 <0.03 ng/mL    Comment: Performed at [facility], [place] [address], [place], [place] [address]  Brain natriuretic peptide     Status: None   Collection Time: 07/18/18  4:22 PM  Result Value Ref Range   B Natriuretic Peptide 27.1 0.0 - 100.0 pg/mL    Comment: Performed at [place] [address], [place], [place] [address]  POCT I-Stat EG7     Status: Abnormal   Collection Time: 07/18/18  6:20 PM  Result Value Ref Range   pH, Ven 7.480 (H) 7.250 - 7.430   pCO2, Ven 38.7 (L) 44.0 - 60.0 mmHg   pO2, Ven 30.0 (LL) 32.0 - 45.0 mmHg   Bicarbonate 28.8 (H)  20.0 - 28.0 mmol/L   TCO2 30 22 - 32 mmol/L   O2 Saturation 62.0 %   Acid-Base Excess 5.0 (H) 0.0 - 2.0 mmol/L   Sodium 133 (L) 135 - 145 mmol/L   Potassium 4.1 3.5 - 5.1 mmol/L   Calcium, Ion 1.15 1.15 - 1.40 mmol/L   HCT 34.0 (L) 36.0 - 46.0 %   Hemoglobin 11.6 (L) 12.0 - 15.0 g/dL   Patient temperature HIDE    Sample type VENOUS    Comment NOTIFIED PHYSICIAN    Dg Chest 2 View  Result Date: 07/18/2018 CLINICAL DATA:  Shortness of breath EXAM: CHEST - 2 VIEW COMPARISON:  07/05/2018, 07/04/2018, 06/27/2018 FINDINGS: Moderate bilateral pleural effusion without significant interval change. Basilar airspace disease. Probable cardiomegaly. Vascular calcification. Right-sided chest drainage catheter with tip at the right apex. No pneumothorax. IMPRESSION: 1. Right-sided chest drainage catheter with tip positioned over the right apex. 2. Similar size and appearance of moderate bilateral pleural effusions and basilar airspace disease. 3. Vascular congestion Electronically Signed   By: Donavan Foil M.D.   On: 07/18/2018 17:22    Pending Labs Unresulted Labs (From admission, onward)    Start     Ordered   07/18/18 1752  Blood gas, venous (at [facility])  ONCE - STAT,   STAT     07/18/18 1752          Vitals/Pain Today's Vitals   07/18/18 1700 07/18/18 1730 07/18/18 1745 07/18/18 1749  BP: 118/70 115/81 118/76   Pulse: 82 75 73   Resp: 16 (!) 33 (!) 27   SpO2: 94% 97% 94%   Weight:      Height:      PainSc:    0-No pain    Isolation Precautions No active isolations  Medications Medications  ondansetron (ZOFRAN) injection 4 mg (4 mg Intravenous Not Given 07/18/18 1804)  lactated ringers bolus 500 mL (500 mLs Intravenous New Bag/Given 07/18/18 1804)    Mobility walks Low fall risk   Focused Assessments Pulmonary Assessment Handoff:  Lung sounds: Bilateral Breath Sounds: Diminished L Breath Sounds: Diminished R Breath Sounds: Diminished O2 Device: Room Air         R Recommendations: See Admitting Provider Note  Report given to:   Additional Notes: Hx pulmonary effusions with drain in place.  Pt on room air >94% at this time. Denies pain.

## 2018-07-18 NOTE — ED Provider Notes (Signed)
[facility] DEPARTMENT Provider Note   CSN: [id number] Arrival date & time: 07/18/18  1612    History   Chief Complaint Chief Complaint  Patient presents with  . Shortness of Breath  . Emesis    HPI Ann Cline is a 85 y.o. female.      Shortness of Breath  Severity:  Moderate Onset quality:  Sudden Duration:  2 days Timing:  Constant Progression:  Worsening Chronicity:  Recurrent Context: not activity, not animal exposure, not pollens, not strong odors, not URI and not weather changes   Relieved by:  None tried Worsened by:  Nothing Ineffective treatments:  None tried Associated symptoms: vomiting   Associated symptoms: no abdominal pain, no chest pain, no cough, no ear pain, no fever, no rash, no sore throat and no sputum production   Risk factors: hx of cancer   Risk factors: no prolonged immobilization   Emesis  Associated symptoms: no abdominal pain, no arthralgias, no chills, no cough, no fever and no sore throat     Past Medical History:  Diagnosis Date  . Cancer ([place]) 07/12/2018   pancreatic cancer  . CHF (congestive heart failure) [facility])     Patient Active Problem List   Diagnosis Date Noted  . Acute respiratory failure with hypoxia ([place]) 07/18/2018  . Normochromic normocytic anemia 07/18/2018  . Hyperkalemia 07/07/2018  . Essential hypertension 07/07/2018  . Microcytic anemia 07/07/2018  . Dyspnea 07/04/2018  . Malignant pleural effusion 07/04/2018  . CHF (congestive heart failure) ([place]) 07/01/2018  . HCAP (healthcare-associated pneumonia) 06/16/2018  . Pneumonia 06/15/2018  . Hyperglycemia 06/15/2018  . Trigger finger of right thumb 09/22/2016    Past Surgical History:  Procedure Laterality Date  . ABDOMINAL HYSTERECTOMY  1975  . IR THORACENTESIS ASP PLEURAL SPACE W/IMG GUIDE  06/17/2018  . IR THORACENTESIS ASP PLEURAL SPACE W/IMG GUIDE  07/05/2018     OB History   No obstetric history on file.       Home Medications    Prior to Admission medications   Medication Sig Start Date End Date Taking? Authorizing Provider  acetaminophen (TYLENOL) 500 MG tablet Take 1,000 mg by mouth every 6 (six) hours as needed for mild pain or headache.   Yes [provider]  alendronate (FOSAMAX) 70 MG tablet Take 70 mg by mouth every Tuesday.  05/15/18  Yes [provider]  carboxymethylcellulose (REFRESH PLUS) 0.5 % SOLN Place 1 drop into both eyes as needed (for dry eyes).    Yes [provider]  carvedilol (COREG) 3.125 MG tablet Take 1 tablet (3.125 mg total) by mouth 2 (two) times daily with a meal. 07/01/18  Yes Revankar, Reita Cliche, MD  docusate sodium (COLACE) 100 MG capsule Take 2 capsules (200 mg total) by mouth 2 (two) times daily as needed for mild constipation. 06/20/18  Yes Thurnell Lose, MD  ferrous sulfate 325 (65 FE) MG tablet Take 1 tablet (325 mg total) by mouth 2 (two) times daily with a meal. Patient taking differently: Take 325 mg by mouth every Monday, Wednesday, and Friday.  06/20/18  Yes Thurnell Lose, MD  furosemide (LASIX) 40 MG tablet Take 1 tablet (40 mg total) by mouth daily. 06/20/18  Yes Thurnell Lose, MD  Multiple Vitamin (MULTIVITAMIN) capsule Take 1 capsule by mouth daily.   Yes [provider]  polyethylene glycol powder (GLYCOLAX/MIRALAX) powder Take 17 g by mouth daily as needed for moderate constipation. 07/15/18  Yes [provider]  traMADol (ULTRAM) 50 MG tablet Take 50 mg by mouth 2 (two) times daily as needed for moderate pain. 07/15/18  Yes [provider]    Family History Family History  Problem Relation Age of Onset  . Cancer Sister   . Cancer Brother     Social History Social History   Tobacco Use  . Smoking status: Former Research scientist (life sciences)  . Smokeless tobacco: Never Used  Substance Use Topics  . Alcohol use: Never    Frequency: Never  . Drug use: Never     Allergies   Patient has no known  allergies.   Review of Systems Review of Systems  Constitutional: Negative for chills and fever.  HENT: Negative for ear pain and sore throat.   Eyes: Negative for pain and visual disturbance.  Respiratory: Positive for shortness of breath. Negative for cough and sputum production.   Cardiovascular: Negative for chest pain and palpitations.  Gastrointestinal: Positive for vomiting. Negative for abdominal pain.  Genitourinary: Negative for dysuria and hematuria.  Musculoskeletal: Negative for arthralgias and back pain.  Skin: Negative for color change and rash.  Neurological: Negative for seizures and syncope.  All other systems reviewed and are negative.    Physical Exam Updated Vital Signs BP 122/69 (BP Location: Right Arm)   Pulse 78   Temp 98.6 F (37 C) (Oral)   Resp (!) 24   Ht 5' (1.524 m)   Wt 57.6 kg   SpO2 91%   BMI 24.80 kg/m   Physical Exam Vitals signs and nursing note reviewed.  Constitutional:      General: She is not in acute distress.    Appearance: She is well-developed.     Comments: Patient resting comfortably, no acute distress.  HENT:     Head: Normocephalic and atraumatic.  Eyes:     Conjunctiva/sclera: Conjunctivae normal.  Neck:     Musculoskeletal: Neck supple.  Cardiovascular:     Rate and Rhythm: Normal rate and regular rhythm.     Heart sounds: No murmur.  Pulmonary:     Effort: Pulmonary effort is normal. No respiratory distress.     Breath sounds: Examination of the right-middle field reveals decreased breath sounds. Examination of the right-lower field reveals decreased breath sounds. Examination of the left-lower field reveals decreased breath sounds. Decreased breath sounds present.  Abdominal:     Palpations: Abdomen is soft.     Tenderness: There is no abdominal tenderness.  Skin:    General: Skin is warm and dry.     Capillary Refill: Capillary refill takes less than 2 seconds.  Neurological:     General: No focal deficit  present.     Mental Status: She is alert.  Psychiatric:        Mood and Affect: Mood normal.      ED Treatments / Results  Labs (all labs ordered are listed, but only abnormal results are displayed) Labs Reviewed  BASIC METABOLIC PANEL - Abnormal; Notable for the following components:      Result Value   Sodium 132 (*)    Chloride 95 (*)    Glucose, Bld 118 (*)    Creatinine, Ser 1.03 (*)    GFR calc non Af Amer 49 (*)    GFR calc Af Amer 57 (*)    All other components within normal limits  CBC - Abnormal; Notable for the following components:   RBC 5.14 (*)    Hemoglobin 11.8 (*)    MCV  75.1 (*)    MCH 23.0 (*)    All other components within normal limits  POCT I-STAT EG7 - Abnormal; Notable for the following components:   pH, Ven 7.480 (*)    pCO2, Ven 38.7 (*)    pO2, Ven 30.0 (*)    Bicarbonate 28.8 (*)    Acid-Base Excess 5.0 (*)    Sodium 133 (*)    HCT 34.0 (*)    Hemoglobin 11.6 (*)    All other components within normal limits  MRSA PCR SCREENING  TROPONIN I  BRAIN NATRIURETIC PEPTIDE  BASIC METABOLIC PANEL  CBC    EKG EKG Interpretation  Date/Time:  Thursday July 18 2018 16:22:02 EST Ventricular Rate:  73 PR Interval:    QRS Duration: 93 QT Interval:  379 QTC Calculation: 418 R Axis:   -15 Text Interpretation:  Sinus rhythm Borderline left axis deviation Anterior infarct, old No acute changes No significant change since last tracing Confirmed by Varney Biles [phone]) on 07/18/2018 4:43:37 PM   Radiology Dg Chest 2 View  Result Date: 07/18/2018 CLINICAL DATA:  Shortness of breath EXAM: CHEST - 2 VIEW COMPARISON:  07/05/2018, 07/04/2018, 06/27/2018 FINDINGS: Moderate bilateral pleural effusion without significant interval change. Basilar airspace disease. Probable cardiomegaly. Vascular calcification. Right-sided chest drainage catheter with tip at the right apex. No pneumothorax. IMPRESSION: 1. Right-sided chest drainage catheter with tip positioned  over the right apex. 2. Similar size and appearance of moderate bilateral pleural effusions and basilar airspace disease. 3. Vascular congestion Electronically Signed   By: Donavan Foil M.D.   On: 07/18/2018 17:22    Procedures Procedures (including critical care time)  Medications Ordered in ED Medications  ondansetron (ZOFRAN) injection 4 mg (4 mg Intravenous Not Given 07/18/18 1804)  traMADol (ULTRAM) tablet 50 mg (has no administration in time range)  carvedilol (COREG) tablet 3.125 mg (has no administration in time range)  docusate sodium (COLACE) capsule 200 mg (has no administration in time range)  ferrous sulfate tablet 325 mg (has no administration in time range)  acetaminophen (TYLENOL) tablet 650 mg (has no administration in time range)    Or  acetaminophen (TYLENOL) suppository 650 mg (has no administration in time range)  ondansetron (ZOFRAN) tablet 4 mg (has no administration in time range)    Or  ondansetron (ZOFRAN) injection 4 mg (has no administration in time range)  lactated ringers bolus 500 mL (0 mLs Intravenous Stopped 07/18/18 2015)     Initial Impression / Assessment and Plan / ED Course  I have reviewed the triage vital signs and the nursing notes.  Pertinent labs & imaging results that were available during my care of the patient were reviewed by me and considered in my medical decision making (see chart for details).        85 y.o.female,past medical history recent hospitalization secondary to dyspnea, diagnosed with malignant pleural effusion likely secondary due to malignancy from pancreatic cancer.  Patient has right in-place chest tube which is being drained at home by home nursing staff.  Patient had 450 cc drawn off yesterday with some improvement of her symptoms however today the symptoms of shortness of breath have worsened, patient continues to be tachypneic.  Physical exam consistent with tachypnea into the 30s, increased work of breathing with  oxygen saturations about 93% on room air.  Baseline laboratory studies appropriate however venous blood gas shows PO2 in the 30s.  Will admit for continued work of breathing and likely bilateral pleural effusions, patient might  require second drain placement.  Family and patient in agreement with this plan.  Inpatient team in agreement.  Patient met and stable additional stable vital signs.   The above care was discussed and agreed upon by my attending physician.  Final Clinical Impressions(s) / ED Diagnoses   Final diagnoses:  Malignant pleural effusion  SOB (shortness of breath)    ED Discharge Orders    None       Orson Aloe, MD 07/18/18 1497    Varney Biles, MD 07/19/18 0263

## 2018-07-19 ENCOUNTER — Observation Stay (HOSPITAL_COMMUNITY): Payer: Medicare HMO

## 2018-07-19 DIAGNOSIS — I509 Heart failure, unspecified: Secondary | ICD-10-CM | POA: Diagnosis not present

## 2018-07-19 DIAGNOSIS — C782 Secondary malignant neoplasm of pleura: Secondary | ICD-10-CM | POA: Diagnosis not present

## 2018-07-19 DIAGNOSIS — R1110 Vomiting, unspecified: Secondary | ICD-10-CM | POA: Diagnosis present

## 2018-07-19 DIAGNOSIS — K8689 Other specified diseases of pancreas: Secondary | ICD-10-CM

## 2018-07-19 DIAGNOSIS — C801 Malignant (primary) neoplasm, unspecified: Secondary | ICD-10-CM | POA: Diagnosis not present

## 2018-07-19 DIAGNOSIS — J910 Malignant pleural effusion: Secondary | ICD-10-CM

## 2018-07-19 DIAGNOSIS — J9601 Acute respiratory failure with hypoxia: Secondary | ICD-10-CM | POA: Diagnosis not present

## 2018-07-19 DIAGNOSIS — R0602 Shortness of breath: Secondary | ICD-10-CM

## 2018-07-19 DIAGNOSIS — I5030 Unspecified diastolic (congestive) heart failure: Secondary | ICD-10-CM | POA: Diagnosis not present

## 2018-07-19 LAB — BASIC METABOLIC PANEL
Anion gap: 8 (ref 5–15)
BUN: 15 mg/dL (ref 8–23)
CHLORIDE: 101 mmol/L (ref 98–111)
CO2: 24 mmol/L (ref 22–32)
Calcium: 8.2 mg/dL — ABNORMAL LOW (ref 8.9–10.3)
Creatinine, Ser: 0.86 mg/dL (ref 0.44–1.00)
GFR calc Af Amer: >60 mL/min (ref >60)
GFR calc non Af Amer: >60 mL/min (ref >60)
Glucose, Bld: 126 mg/dL — ABNORMAL HIGH (ref 70–99)
Potassium: 4 mmol/L (ref 3.5–5.1)
SODIUM: 133 mmol/L — AB (ref 135–145)

## 2018-07-19 LAB — PROTEIN, TOTAL: Total Protein: 5.5 g/dL — ABNORMAL LOW (ref 6.5–8.1)

## 2018-07-19 LAB — BODY FLUID CELL COUNT WITH DIFFERENTIAL
Eos, Fluid: 1 %
Lymphs, Fluid: 50 %
Monocyte-Macrophage-Serous Fluid: 19 % — ABNORMAL LOW (ref 50–90)
Neutrophil Count, Fluid: 30 % — ABNORMAL HIGH (ref 0–25)
WBC FLUID: 623 uL (ref 0–1000)

## 2018-07-19 LAB — GRAM STAIN

## 2018-07-19 LAB — CBC
HCT: 33 % — ABNORMAL LOW (ref 36.0–46.0)
Hemoglobin: 10.3 g/dL — ABNORMAL LOW (ref 12.0–15.0)
MCH: 22.9 pg — ABNORMAL LOW (ref 26.0–34.0)
MCHC: 31.2 g/dL (ref 30.0–36.0)
MCV: 73.5 fL — ABNORMAL LOW (ref 80.0–100.0)
Platelets: 250 10*3/uL (ref 150–400)
RBC: 4.49 MIL/uL (ref 3.87–5.11)
RDW: 13.4 % (ref 11.5–15.5)
WBC: 7.3 10*3/uL (ref 4.0–10.5)
nRBC: 0 % (ref 0.0–0.2)

## 2018-07-19 LAB — MRSA PCR SCREENING: MRSA by PCR: NEGATIVE

## 2018-07-19 LAB — LACTATE DEHYDROGENASE, PLEURAL OR PERITONEAL FLUID: LD, Fluid: 305 U/L — ABNORMAL HIGH (ref 3–23)

## 2018-07-19 LAB — ALBUMIN: Albumin: 2.4 g/dL — ABNORMAL LOW (ref 3.5–5.0)

## 2018-07-19 MED ORDER — AMOXICILLIN-POT CLAVULANATE 875-125 MG PO TABS
1.0000 | ORAL_TABLET | Freq: Two times a day (BID) | ORAL | Status: DC
  Administered 2018-07-19: 1 via ORAL
  Filled 2018-07-19: qty 1

## 2018-07-19 MED ORDER — PIPERACILLIN-TAZOBACTAM 3.375 G IVPB 30 MIN
3.3750 g | Freq: Once | INTRAVENOUS | Status: AC
  Administered 2018-07-19: 3.375 g via INTRAVENOUS
  Filled 2018-07-19: qty 50

## 2018-07-19 MED ORDER — PIPERACILLIN-TAZOBACTAM 3.375 G IVPB
3.3750 g | Freq: Three times a day (TID) | INTRAVENOUS | Status: DC
  Administered 2018-07-19: 3.375 g via INTRAVENOUS
  Filled 2018-07-19: qty 50

## 2018-07-19 MED ORDER — AMOXICILLIN-POT CLAVULANATE 875-125 MG PO TABS: 1.0000 | ORAL_TABLET | Freq: Two times a day (BID) | ORAL | 0 refills | Status: AC

## 2018-07-19 NOTE — Discharge Summary (Signed)
Discharge Summary  Ann Cline:[id number] DOB: Aug 13, 1928  PCP: Philmore Pali, NP  Admit date: 07/18/2018 Discharge date: 07/19/2018   Time spent: < 25 minutes  Admitted From: home Disposition:  home  Recommendations for Outpatient Follow-up:  1. Follow up with PCP in 1 week 2. Discharged on home oxygen ( 2L with ambulation), will continue daily pleurx drainage via catheter with home health nursing 3.     Discharge Diagnoses:  Active Hospital Problems   Diagnosis Date Noted  . Acute respiratory failure with hypoxia ([place]) 07/18/2018  . Normochromic normocytic anemia 07/18/2018  . Malignant pleural effusion 07/04/2018  . CHF (congestive heart failure) ([place]) 07/01/2018    Resolved Hospital Problems  No resolved problems to display.    Discharge Condition: STABLE   CODE STATUS:FULL    History of present illness:  Ann Cline is a 85 y.o. year old female with medical history significant for recurrent malignant pleural effusion with chronic pleurx catheter for daily drainage who presented on 07/18/2018 with worsening dyspnea after skipping a day draining her pleurx at home with some nausea and vomiting and was found to have acute hypoxic respiratory failure secondary to recurrent malignant pleural effusions complicated by post-tussive emesis. Remaining hospital course addressed in problem based format below:   Hospital Course:   Acute hypoxic respiratory failure secondary to recurrent malignant pleural effusions She usually has her chronic right sided pleurx catheter drained daily by nursing but had skipped a day. Required 2 L on admission ( normally on no oxygen at home). CXR shows similar size in known bilateral pleural effusions with some basilar airspace opacities. Given some post-tussive emesis she was empirically put on zosyn and transitioned to augmentin for aspiration. No signs of sepsis during stay.   Her symptoms improved significantly with pleurx catheter  drainage though repeat CXR showed no change in size of pleural effusions.  On ambulatory oxygen testing she continued to need oxygen which was provided at discharge ( 2 L). Patient and daughter agreeable to plan.   Pancreatic mass with malignant pleural effusions. Has pleurx on right side. Daughter does not want a left sided catheter placed at this time and will discuss with outpatient proceduralist. Has outpatient oncologist, with appointment upcoming.   Nausea and vomiting, resolved. Vomiting was post-tussive. Abdominal pain occurs with cough. Likely related to pancreatic mass as well. Lipase wnl. No imaging needed as improved during stay on its own.   CHFpEF, euvolemic. No changes in home meds.   Consultations:  none  Procedures/Studies: none  Discharge Exam: BP 106/69 (BP Location: Right Arm)   Pulse 70   Temp 97.7 F (36.5 C) (Oral)   Resp (!) 25   Ht 5' (1.524 m)   Wt 57.7 kg   SpO2 99%   BMI 24.84 kg/m   General: Lying in bed, no apparent distress Eyes: EOMI, anicteric ENT: Oral Mucosa clear and moist Cardiovascular: regular rate and rhythm, no murmurs, rubs or gallops, no edema, Respiratory: Normal respiratory effort on 2 L,  Diminished breath sounds at bases, no rales or crackles Abdomen: soft, non-distended, non-tender, normal bowel sounds Skin: No Rash Neurologic: Grossly no focal neuro deficit.Mental status AAOx3, speech normal, Psychiatric:Appropriate affect, and mood   Discharge Instructions You were cared for by a hospitalist during your hospital stay. If you have any questions about your discharge medications or the care you received while you were in the hospital after you are discharged, you can call the unit and asked to speak  with the hospitalist on call if the hospitalist that took care of you is not available. Once you are discharged, your primary care physician will handle any further medical issues. Please note that NO REFILLS for any discharge  medications will be authorized once you are discharged, as it is imperative that you return to your primary care physician (or establish a relationship with a primary care physician if you do not have one) for your aftercare needs so that they can reassess your need for medications and monitor your lab values.  Discharge Instructions    Diet - low sodium heart healthy   Complete by:  As directed    Increase activity slowly   Complete by:  As directed      Allergies as of 07/19/2018   No Known Allergies     Medication List    TAKE these medications   acetaminophen 500 MG tablet Commonly known as:  TYLENOL Take 1,000 mg by mouth every 6 (six) hours as needed for mild pain or headache.   alendronate 70 MG tablet Commonly known as:  FOSAMAX Take 70 mg by mouth every Tuesday.   amoxicillin-clavulanate 875-125 MG tablet Commonly known as:  AUGMENTIN Take 1 tablet by mouth every 12 (twelve) hours for 5 days.   carboxymethylcellulose 0.5 % Soln Commonly known as:  REFRESH PLUS Place 1 drop into both eyes as needed (for dry eyes).   carvedilol 3.125 MG tablet Commonly known as:  COREG Take 1 tablet (3.125 mg total) by mouth 2 (two) times daily with a meal.   docusate sodium 100 MG capsule Commonly known as:  COLACE Take 2 capsules (200 mg total) by mouth 2 (two) times daily as needed for mild constipation.   ferrous sulfate 325 (65 FE) MG tablet Take 1 tablet (325 mg total) by mouth 2 (two) times daily with a meal. What changed:  when to take this   furosemide 40 MG tablet Commonly known as:  Lasix Take 1 tablet (40 mg total) by mouth daily.   multivitamin capsule Take 1 capsule by mouth daily.   polyethylene glycol powder powder Commonly known as:  GLYCOLAX/MIRALAX Take 17 g by mouth daily as needed for moderate constipation.   traMADol 50 MG tablet Commonly known as:  ULTRAM Take 50 mg by mouth 2 (two) times daily as needed for moderate pain.            Durable  Medical Equipment  (From admission, onward)         Start     Ordered   07/19/18 1425  For home use only DME oxygen  Once    Question Answer Comment  Mode or (Route) Nasal cannula   Liters per Minute 2   Frequency Continuous (stationary and portable oxygen unit needed)   Oxygen delivery system Gas      07/19/18 1428         No Known Allergies    The results of significant diagnostics from this hospitalization (including imaging, microbiology, ancillary and laboratory) are listed below for reference.    Significant Diagnostic Studies: Dg Chest 1 View  Result Date: 07/05/2018 CLINICAL DATA:  Patient status post right thoracentesis today. EXAM: CHEST  1 VIEW COMPARISON:  PA and lateral chest 07/04/2018. FINDINGS: Right pleural effusion is decreased after thoracentesis. No pneumothorax. Left effusion is unchanged. Bibasilar airspace disease noted. Heart size is normal. No acute or focal bony abnormality. IMPRESSION: Negative for pneumothorax after right thoracentesis. Bilateral pleural effusions and basilar airspace  disease. Right effusion is somewhat smaller. Electronically Signed   By: Inge Rise M.D.   On: 07/05/2018 09:56   Dg Chest 2 View  Result Date: 07/19/2018 CLINICAL DATA:  Shortness of breath, hypoxia EXAM: CHEST - 2 VIEW COMPARISON:  07/18/2018 FINDINGS: Moderate bilateral pleural effusions noted. Right PleurX catheter in place. No pneumothorax. Bilateral lower lobe airspace opacities are similar prior study. Heart is normal size. IMPRESSION: Moderate bilateral effusions with bibasilar airspace opacities. No change since prior study. Electronically Signed   By: Rolm Baptise M.D.   On: 07/19/2018 07:33   Dg Chest 2 View  Result Date: 07/18/2018 CLINICAL DATA:  Shortness of breath EXAM: CHEST - 2 VIEW COMPARISON:  07/05/2018, 07/04/2018, 06/27/2018 FINDINGS: Moderate bilateral pleural effusion without significant interval change. Basilar airspace disease. Probable  cardiomegaly. Vascular calcification. Right-sided chest drainage catheter with tip at the right apex. No pneumothorax. IMPRESSION: 1. Right-sided chest drainage catheter with tip positioned over the right apex. 2. Similar size and appearance of moderate bilateral pleural effusions and basilar airspace disease. 3. Vascular congestion Electronically Signed   By: Donavan Foil M.D.   On: 07/18/2018 17:22   Dg Chest 2 View  Result Date: 07/04/2018 CLINICAL DATA:  85 year old female with a history of shortness of breath EXAM: CHEST - 2 VIEW COMPARISON:  06/27/2018, 06/26/2018, 06/19/2018 FINDINGS: Cardiomediastinal silhouette unchanged in size and contour, partially obscured by overlying lung and pleural disease. Worsening opacities at the lung bases obscuring the heart borders and hemidiaphragms. Meniscus on the lateral view. Thickening of the fissures. No pneumothorax. Coarsened interstitial markings. No displaced fracture. Scoliotic curvature. IMPRESSION: Increasing opacities at the lung bases, secondary to bilateral pleural effusions with associated atelectasis/consolidation. Mild edema not excluded. Electronically Signed   By: Corrie Mckusick D.O.   On: 07/04/2018 15:23   Ct Abdomen Pelvis W Contrast  Result Date: 07/04/2018 CLINICAL DATA:  Progressively worsening dyspnea since Sunday. EXAM: CT ABDOMEN AND PELVIS WITH CONTRAST TECHNIQUE: Multidetector CT imaging of the abdomen and pelvis was performed using the standard protocol following bolus administration of intravenous contrast. CONTRAST:  13mL OMNIPAQUE IOHEXOL 300 MG/ML  SOLN COMPARISON:  CXR 07/04/2018 and 06/27/2018 FINDINGS: Lower chest: Interval development of moderate right loculated pleural effusion since the 06/27/2018 post right-sided thoracentesis radiographs. Moderate left pleural effusion is again noted as well. There is bibasilar compressive atelectasis noted in addition to subsegmental atelectasis and mild peribronchial thickening.  Hepatobiliary: Nonspecific mild intrahepatic ductal dilatation with in homogeneous attenuation of the liver likely representing perfusion anomaly or geographic infiltration fat. No space-occupying mass is apparent. No displacement of opacified patent vasculature. Pancreas: Hypodense ill-defined pancreatic tail mass extending to the splenic hilum is identified concerning for a pancreatic neoplasm. This measures approximately 3 x 2.9 x 1.8 cm. Spleen: No splenomegaly or mass. Adrenals/Urinary Tract: Normal bilateral adrenal glands, kidneys, ureters and urinary bladder. Stomach/Bowel: Scattered colonic diverticulosis without acute diverticulitis. No bowel obstruction or inflammation. Decompressed stomach. Normal small bowel rotation. Normal appendix. Vascular/Lymphatic: Nonaneurysmal atherosclerotic aorta. Patent branch vessels. No adenopathy. Reproductive: Status post hysterectomy. No adnexal masses. Other: No abdominal wall hernia or abnormality. No abdominopelvic ascites. Musculoskeletal: No aggressive osseous lesions. IMPRESSION: 1. Ill-defined pancreatic tail mass measuring 3 x 2.9 x 1.8 cm concerning for a pancreatic neoplasm. No evidence of metastatic disease. 2. Recurrence of moderate right and stable moderate left loculated pleural effusions with adjacent compressive atelectasis. 3. Colonic diverticulosis without acute diverticulitis. Electronically Signed   By: Ashley Royalty M.D.   On: 07/04/2018 19:14  Ir Thoracentesis Asp Pleural Space W/img Guide  Result Date: 07/05/2018 INDICATION: Patient with history of carcinoma, bilateral pleural effusions. Request is made for therapeutic thoracentesis. EXAM: ULTRASOUND GUIDED THERAPEUTIC RIGHT THORACENTESIS MEDICATIONS: 10 mL 1% lidocaine COMPLICATIONS: None immediate. PROCEDURE: An ultrasound guided thoracentesis was thoroughly discussed with the patient and questions answered. The benefits, risks, alternatives and complications were also discussed. The patient  understands and wishes to proceed with the procedure. Written consent was obtained. Ultrasound was performed to localize and mark an adequate pocket of fluid in the right chest. The area was then prepped and draped in the normal sterile fashion. 1% Lidocaine was used for local anesthesia. Under ultrasound guidance a 6 Fr Safe-T-Centesis catheter was introduced. Thoracentesis was performed. The catheter was removed and a dressing applied. FINDINGS: A total of approximately 550 mL of amber fluid was removed. IMPRESSION: Successful ultrasound guided therapeutic right thoracentesis yielding 550 mL of pleural fluid. Read by: Brynda Greathouse PA-C No pneumothorax on follow-up chest radiograph. Electronically Signed   By: Lucrezia Europe M.D.   On: 07/05/2018 10:45    Microbiology: Recent Results (from the past 240 hour(s))  MRSA PCR Screening     Status: None   Collection Time: 07/18/18 11:17 PM  Result Value Ref Range Status   MRSA by PCR NEGATIVE NEGATIVE Final    Comment:        The GeneXpert MRSA Assay (FDA approved for NASAL specimens only), is one component of a comprehensive MRSA colonization surveillance program. It is not intended to diagnose MRSA infection nor to guide or monitor treatment for MRSA infections. Performed at [facility], [place] [address], [place], [place] [address]   Stat Gram stain     Status: None   Collection Time: 07/19/18  1:39 AM  Result Value Ref Range Status   Specimen Description PLEURAL FLUID THORACENTESIS  Final   Special Requests NONE  Final   Gram Stain   Final    FEW WBC PRESENT,BOTH PMN AND MONONUCLEAR NO ORGANISMS SEEN Performed at [facility], [address] [place], [place] [address]    Report Status 07/19/2018 FINAL  Final  Culture, body fluid-bottle     Status: None (Preliminary result)   Collection Time: 07/19/18  1:39 AM  Result Value Ref Range Status   Specimen Description PLEURAL FLUID THORACENTESIS  Final   Special Requests   Final     BOTTLES DRAWN AEROBIC AND ANAEROBIC Blood Culture adequate volume Performed at [facility], [place] [address] [place], [place] [address]    Culture PENDING  Incomplete   Report Status PENDING  Incomplete     Labs: Basic Metabolic Panel: Recent Labs  Lab 07/18/18 1622 07/18/18 1820 07/19/18 0227  NA 132* 133* 133*  K 4.3 4.1 4.0  CL 95*  --  101  CO2 25  --  24  GLUCOSE 118*  --  126*  BUN 19  --  15  CREATININE 1.03*  --  0.86  CALCIUM 8.9  --  8.2*   Liver Function Tests: Recent Labs  Lab 07/19/18 0227  PROT 5.5*  ALBUMIN 2.4*   No results for input(s): LIPASE, AMYLASE in the last 168 hours. No results for input(s): AMMONIA in the last 168 hours. CBC: Recent Labs  Lab 07/18/18 1622 07/18/18 1820 07/19/18 0227  WBC 9.0  --  7.3  HGB 11.8* 11.6* 10.3*  HCT 38.6 34.0* 33.0*  MCV 75.1*  --  73.5*  PLT 279  --  250   Cardiac Enzymes: Recent Labs  Lab 07/18/18 1622  TROPONINI <0.03   BNP: BNP (last 3 results) Recent Labs    06/15/18 1245 07/18/18 1622  BNP 18.0 27.1    ProBNP (last 3 results) No results for input(s): PROBNP in the last 8760 hours.  CBG: No results for input(s): GLUCAP in the last 168 hours.     Signed:  Desiree Hane, MD [facility] 07/19/2018, 2:30 PM

## 2018-07-19 NOTE — Progress Notes (Signed)
SATURATION QUALIFICATIONS: (This note is used to comply with regulatory documentation for home oxygen)  Patient Saturations on Room Air at Rest = 96%  Patient Saturations on Room Air while Ambulating = 85%  Patient Saturations on 2 Liters of oxygen while Ambulating = 92%  Please briefly explain why patient needs home oxygen: decreases with exertion

## 2018-07-19 NOTE — Care Management Note (Signed)
Case Management Note  Patient Details  Name: FLORDIA KASSEM MRN: [id number] Date of Birth: 1928/12/17  Subjective/Objective:    From home with daughter, she is active with [place] for [facility] for pluerx drain care and HHPT, [place].  Per daughter they want to continue with [facility]. Daughter would like RN to check oxygen saturations to see if patient qualifies for home oxygen.  This NCM notified Butch Penny with [facility] that patient is here and may be for possible dc today.  Soc will begin 24-48 hrs post dc. Patient needs home oxygen, Jermaine will bring oxygen to patient room prior to dc.                               Action/Plan: DC home when ready.   Expected Discharge Date:  07/19/18               Expected Discharge Plan:  [place]  In-House Referral:     Discharge planning Services  CM Consult  Post Acute Care Choice:  Home Health, Resumption of Svcs/PTA Provider Choice offered to:  Adult Children  DME Arranged:  Oxygen DME Agency:  [facility]  HH Arranged:  RN, PT, OT HH Agency:  [place] ([facility])  Status of Service:  Completed, signed off  If discussed at [place] of Stay Meetings, dates discussed:    Additional Comments:  [name], RN 07/19/2018, 3:11 PM

## 2018-07-19 NOTE — Care Management Obs Status (Signed)
[facility] NOTIFICATION   Patient Details  Name: Ann Cline MRN: [id number] Date of Birth: 01-May-1929   Medicare Observation Status Notification Given:  Yes    [name], RN 07/19/2018, 3:02 PM

## 2018-07-19 NOTE — Progress Notes (Signed)
Pharmacy Antibiotic Note  Ann Cline is a 85 y.o. female admitted on 07/18/2018 with pneumonia.  Pharmacy has been consulted for zosyn dosing.  Plan: Zosyn 3.375g IV q8h (4 hour infusion).  Height: 5' (152.4 cm) Weight: 127 lb (57.6 kg) IBW/kg (Calculated) : 45.5  Temp (24hrs), Avg:98.6 F (37 C), Min:98.5 F (36.9 C), Max:98.6 F (37 C)  Recent Labs  Lab 07/18/18 1622 07/19/18 0227  WBC 9.0 7.3  CREATININE 1.03* 0.86    Estimated Creatinine Clearance: 36.6 mL/min (by C-G formula based on SCr of 0.86 mg/dL).    No Known Allergies   Thank you for allowing pharmacy to be a part of this patient's care.  Excell Seltzer P[place]et 07/19/2018 6:08 AM

## 2018-07-19 NOTE — Care Management Note (Signed)
Case Management Note  Patient Details  Name: CHESSICA AUDIA MRN: [id number] Date of Birth: 1928-12-16  Subjective/Objective:   From home with daughter, she is active with [place] for [facility] for pluerx drain care and HHPT, [place].  Per daughter they want to continue with [facility]. Daughter would like RN to check oxygen saturations to see if patient qualifies for home oxygen.  This NCM notified Butch Penny with [facility] that patient is here and may be for possible dc today.  Soc will begin 24-48 hrs post dc. Will need HHRN, HHPT, HHOT orders prior to dc and oxygen orders is she needs it.                Action/Plan: DC home when ready.   Expected Discharge Date:                  Expected Discharge Plan:  [place]  In-House Referral:     Discharge planning Services  CM Consult  Post Acute Care Choice:  Home Health, Resumption of Svcs/PTA Provider Choice offered to:  Adult Children  DME Arranged:    DME Agency:     HH Arranged:  RN [place] Agency:  [place] ([facility]  Status of Service:  Completed, signed off  If discussed at [place] of Stay Meetings, dates discussed:    Additional Comments:  [name], RN 07/19/2018, 12:31 PM

## 2018-07-19 NOTE — Progress Notes (Signed)
Patient was stable at discharge. I removed their IV. We reviewed the discharge education. Patient/Family verbalized understanding and had no further questions. Patient left with home O2 and antibiotic prescription had been sent to pt's personal pharmacy.   Prior to discharge, pt declined having pleurx drained, but did request dressing change as clamp was pressing into her skin. Pt's daughter verbalized with me how to do pleurx drain tomorrow. Home health to see pt Sunday 07/21/18.

## 2018-07-19 NOTE — Progress Notes (Signed)
Drained right sided pleural catheter and yielded approximately 600 ml of bright red fluid. Pt complained of pain and nausea during drainage and asked RN to stop. Pt O2 saturations dropped to 87% and placed 2 liters of oxygen nasal cannula on pt. Will continue to monitor pt.

## 2018-07-22 DIAGNOSIS — J9601 Acute respiratory failure with hypoxia: Secondary | ICD-10-CM | POA: Diagnosis not present

## 2018-07-22 DIAGNOSIS — Z90710 Acquired absence of both cervix and uterus: Secondary | ICD-10-CM | POA: Diagnosis not present

## 2018-07-22 DIAGNOSIS — R109 Unspecified abdominal pain: Secondary | ICD-10-CM | POA: Diagnosis not present

## 2018-07-22 DIAGNOSIS — J90 Pleural effusion, not elsewhere classified: Secondary | ICD-10-CM | POA: Diagnosis not present

## 2018-07-22 DIAGNOSIS — R112 Nausea with vomiting, unspecified: Secondary | ICD-10-CM | POA: Diagnosis not present

## 2018-07-22 DIAGNOSIS — J9811 Atelectasis: Secondary | ICD-10-CM | POA: Diagnosis not present

## 2018-07-22 DIAGNOSIS — C259 Malignant neoplasm of pancreas, unspecified: Secondary | ICD-10-CM | POA: Diagnosis not present

## 2018-07-22 DIAGNOSIS — I509 Heart failure, unspecified: Secondary | ICD-10-CM | POA: Diagnosis not present

## 2018-07-22 DIAGNOSIS — R079 Chest pain, unspecified: Secondary | ICD-10-CM | POA: Diagnosis not present

## 2018-07-22 DIAGNOSIS — Z9981 Dependence on supplemental oxygen: Secondary | ICD-10-CM | POA: Diagnosis not present

## 2018-07-22 DIAGNOSIS — R918 Other nonspecific abnormal finding of lung field: Secondary | ICD-10-CM | POA: Diagnosis not present

## 2018-07-22 DIAGNOSIS — Z9889 Other specified postprocedural states: Secondary | ICD-10-CM | POA: Diagnosis not present

## 2018-07-22 DIAGNOSIS — Z7983 Long term (current) use of bisphosphonates: Secondary | ICD-10-CM | POA: Diagnosis not present

## 2018-07-22 DIAGNOSIS — Z87891 Personal history of nicotine dependence: Secondary | ICD-10-CM | POA: Diagnosis not present

## 2018-07-22 DIAGNOSIS — I110 Hypertensive heart disease with heart failure: Secondary | ICD-10-CM | POA: Diagnosis not present

## 2018-07-22 DIAGNOSIS — Z85828 Personal history of other malignant neoplasm of skin: Secondary | ICD-10-CM | POA: Diagnosis not present

## 2018-07-22 DIAGNOSIS — J910 Malignant pleural effusion: Secondary | ICD-10-CM | POA: Diagnosis not present

## 2018-07-22 DIAGNOSIS — I5032 Chronic diastolic (congestive) heart failure: Secondary | ICD-10-CM | POA: Diagnosis not present

## 2018-07-22 DIAGNOSIS — K869 Disease of pancreas, unspecified: Secondary | ICD-10-CM | POA: Diagnosis not present

## 2018-07-22 DIAGNOSIS — R531 Weakness: Secondary | ICD-10-CM | POA: Diagnosis not present

## 2018-07-22 DIAGNOSIS — R0602 Shortness of breath: Secondary | ICD-10-CM | POA: Diagnosis not present

## 2018-07-23 DIAGNOSIS — I509 Heart failure, unspecified: Secondary | ICD-10-CM | POA: Diagnosis not present

## 2018-07-23 DIAGNOSIS — R112 Nausea with vomiting, unspecified: Secondary | ICD-10-CM | POA: Diagnosis not present

## 2018-07-23 DIAGNOSIS — J90 Pleural effusion, not elsewhere classified: Secondary | ICD-10-CM | POA: Diagnosis not present

## 2018-07-23 DIAGNOSIS — R109 Unspecified abdominal pain: Secondary | ICD-10-CM | POA: Diagnosis not present

## 2018-07-23 DIAGNOSIS — R0602 Shortness of breath: Secondary | ICD-10-CM | POA: Diagnosis not present

## 2018-07-23 DIAGNOSIS — R079 Chest pain, unspecified: Secondary | ICD-10-CM | POA: Diagnosis not present

## 2018-07-23 DIAGNOSIS — R531 Weakness: Secondary | ICD-10-CM | POA: Diagnosis not present

## 2018-07-23 DIAGNOSIS — Z87891 Personal history of nicotine dependence: Secondary | ICD-10-CM | POA: Diagnosis not present

## 2018-07-23 DIAGNOSIS — K869 Disease of pancreas, unspecified: Secondary | ICD-10-CM | POA: Diagnosis not present

## 2018-07-24 DIAGNOSIS — R0602 Shortness of breath: Secondary | ICD-10-CM | POA: Diagnosis not present

## 2018-07-24 DIAGNOSIS — R531 Weakness: Secondary | ICD-10-CM | POA: Diagnosis not present

## 2018-07-24 DIAGNOSIS — Z87891 Personal history of nicotine dependence: Secondary | ICD-10-CM | POA: Diagnosis not present

## 2018-07-24 DIAGNOSIS — K869 Disease of pancreas, unspecified: Secondary | ICD-10-CM | POA: Diagnosis not present

## 2018-07-24 DIAGNOSIS — R112 Nausea with vomiting, unspecified: Secondary | ICD-10-CM | POA: Diagnosis not present

## 2018-07-24 DIAGNOSIS — R918 Other nonspecific abnormal finding of lung field: Secondary | ICD-10-CM | POA: Diagnosis not present

## 2018-07-24 DIAGNOSIS — J90 Pleural effusion, not elsewhere classified: Secondary | ICD-10-CM | POA: Diagnosis not present

## 2018-07-24 DIAGNOSIS — R109 Unspecified abdominal pain: Secondary | ICD-10-CM | POA: Diagnosis not present

## 2018-07-24 DIAGNOSIS — R079 Chest pain, unspecified: Secondary | ICD-10-CM | POA: Diagnosis not present

## 2018-07-24 LAB — CULTURE, BODY FLUID W GRAM STAIN -BOTTLE
Culture: NO GROWTH
Special Requests: ADEQUATE

## 2018-07-24 LAB — CULTURE, BODY FLUID-BOTTLE

## 2018-07-25 DIAGNOSIS — R1310 Dysphagia, unspecified: Secondary | ICD-10-CM | POA: Diagnosis not present

## 2018-07-25 DIAGNOSIS — M810 Age-related osteoporosis without current pathological fracture: Secondary | ICD-10-CM | POA: Diagnosis not present

## 2018-07-25 DIAGNOSIS — I110 Hypertensive heart disease with heart failure: Secondary | ICD-10-CM | POA: Diagnosis not present

## 2018-07-25 DIAGNOSIS — J189 Pneumonia, unspecified organism: Secondary | ICD-10-CM | POA: Diagnosis not present

## 2018-07-25 DIAGNOSIS — J918 Pleural effusion in other conditions classified elsewhere: Secondary | ICD-10-CM | POA: Diagnosis not present

## 2018-07-25 DIAGNOSIS — D649 Anemia, unspecified: Secondary | ICD-10-CM | POA: Diagnosis not present

## 2018-07-25 DIAGNOSIS — I5033 Acute on chronic diastolic (congestive) heart failure: Secondary | ICD-10-CM | POA: Diagnosis not present

## 2018-07-26 DIAGNOSIS — J189 Pneumonia, unspecified organism: Secondary | ICD-10-CM | POA: Diagnosis not present

## 2018-07-26 DIAGNOSIS — M810 Age-related osteoporosis without current pathological fracture: Secondary | ICD-10-CM | POA: Diagnosis not present

## 2018-07-26 DIAGNOSIS — R1310 Dysphagia, unspecified: Secondary | ICD-10-CM | POA: Diagnosis not present

## 2018-07-26 DIAGNOSIS — J918 Pleural effusion in other conditions classified elsewhere: Secondary | ICD-10-CM | POA: Diagnosis not present

## 2018-07-26 DIAGNOSIS — D649 Anemia, unspecified: Secondary | ICD-10-CM | POA: Diagnosis not present

## 2018-07-26 DIAGNOSIS — I110 Hypertensive heart disease with heart failure: Secondary | ICD-10-CM | POA: Diagnosis not present

## 2018-07-26 DIAGNOSIS — I5033 Acute on chronic diastolic (congestive) heart failure: Secondary | ICD-10-CM | POA: Diagnosis not present

## 2018-07-28 ENCOUNTER — Emergency Department (HOSPITAL_COMMUNITY)
Admission: EM | Admit: 2018-07-28 | Discharge: 2018-07-28 | Disposition: A | Discharge status: Home / Self Care | Payer: Medicare HMO | Attending: Emergency Medicine | Admitting: Emergency Medicine

## 2018-07-28 ENCOUNTER — Other Ambulatory Visit: Payer: Self-pay

## 2018-07-28 ENCOUNTER — Encounter (HOSPITAL_COMMUNITY): Payer: Self-pay | Admitting: Emergency Medicine

## 2018-07-28 ENCOUNTER — Emergency Department (HOSPITAL_COMMUNITY): Payer: Medicare HMO

## 2018-07-28 DIAGNOSIS — Z8507 Personal history of malignant neoplasm of pancreas: Secondary | ICD-10-CM | POA: Diagnosis not present

## 2018-07-28 DIAGNOSIS — J90 Pleural effusion, not elsewhere classified: Secondary | ICD-10-CM | POA: Diagnosis not present

## 2018-07-28 DIAGNOSIS — R0602 Shortness of breath: Secondary | ICD-10-CM | POA: Diagnosis not present

## 2018-07-28 DIAGNOSIS — I509 Heart failure, unspecified: Secondary | ICD-10-CM | POA: Insufficient documentation

## 2018-07-28 DIAGNOSIS — Z79899 Other long term (current) drug therapy: Secondary | ICD-10-CM | POA: Insufficient documentation

## 2018-07-28 DIAGNOSIS — I110 Hypertensive heart disease with heart failure: Secondary | ICD-10-CM | POA: Diagnosis not present

## 2018-07-28 DIAGNOSIS — Z87891 Personal history of nicotine dependence: Secondary | ICD-10-CM | POA: Insufficient documentation

## 2018-07-28 LAB — CBC WITH DIFFERENTIAL/PLATELET
Abs Immature Granulocytes: 0.03 10*3/uL (ref 0.00–0.07)
BASOS ABS: 0 10*3/uL (ref 0.0–0.1)
Basophils Relative: 1 %
Eosinophils Absolute: 0.2 10*3/uL (ref 0.0–0.5)
Eosinophils Relative: 2 %
HCT: 40 % (ref 36.0–46.0)
Hemoglobin: 12 g/dL (ref 12.0–15.0)
Immature Granulocytes: 0 %
Lymphocytes Relative: 16 %
Lymphs Abs: 1.3 10*3/uL (ref 0.7–4.0)
MCH: 22.9 pg — ABNORMAL LOW (ref 26.0–34.0)
MCHC: 30 g/dL (ref 30.0–36.0)
MCV: 76.2 fL — ABNORMAL LOW (ref 80.0–100.0)
Monocytes Absolute: 0.7 10*3/uL (ref 0.1–1.0)
Monocytes Relative: 8 %
NRBC: 0 % (ref 0.0–0.2)
Neutro Abs: 6 10*3/uL (ref 1.7–7.7)
Neutrophils Relative %: 73 %
Platelets: 269 10*3/uL (ref 150–400)
RBC: 5.25 MIL/uL — ABNORMAL HIGH (ref 3.87–5.11)
RDW: 13.6 % (ref 11.5–15.5)
WBC: 8.1 10*3/uL (ref 4.0–10.5)

## 2018-07-28 LAB — COMPREHENSIVE METABOLIC PANEL
ALT: 21 U/L (ref 0–44)
AST: 24 U/L (ref 15–41)
Albumin: 2.9 g/dL — ABNORMAL LOW (ref 3.5–5.0)
Alkaline Phosphatase: 178 U/L — ABNORMAL HIGH (ref 38–126)
Anion gap: 5 (ref 5–15)
BUN: 14 mg/dL (ref 8–23)
CO2: 26 mmol/L (ref 22–32)
CREATININE: 0.92 mg/dL (ref 0.44–1.00)
Calcium: 8 mg/dL — ABNORMAL LOW (ref 8.9–10.3)
Chloride: 97 mmol/L — ABNORMAL LOW (ref 98–111)
GFR calc Af Amer: >60 mL/min (ref >60)
GFR, EST NON AFRICAN AMERICAN: 56 mL/min — AB (ref >60)
Glucose, Bld: 176 mg/dL — ABNORMAL HIGH (ref 70–99)
Potassium: 4 mmol/L (ref 3.5–5.1)
Sodium: 128 mmol/L — ABNORMAL LOW (ref 135–145)
Total Bilirubin: 0.4 mg/dL (ref 0.3–1.2)
Total Protein: 6.5 g/dL (ref 6.5–8.1)

## 2018-07-28 LAB — I-STAT TROPONIN, ED: Troponin i, poc: 0 ng/mL (ref 0.00–0.08)

## 2018-07-28 LAB — BRAIN NATRIURETIC PEPTIDE: B Natriuretic Peptide: 23.7 pg/mL (ref 0.0–100.0)

## 2018-07-28 LAB — LIPASE, BLOOD: Lipase: 18 U/L (ref 11–51)

## 2018-07-28 MED ORDER — FUROSEMIDE 10 MG/ML IJ SOLN
40.0000 mg | Freq: Once | INTRAMUSCULAR | Status: AC
  Administered 2018-07-28: 40 mg via INTRAVENOUS
  Filled 2018-07-28: qty 4

## 2018-07-28 MED ORDER — FUROSEMIDE 20 MG PO TABS: 20.0000 mg | ORAL_TABLET | Freq: Every day | ORAL | 0 refills | Status: AC

## 2018-07-28 NOTE — ED Provider Notes (Signed)
[facility] DEPARTMENT Provider Note   CSN: [id number] Arrival date & time: 07/28/18  1459    History   Chief Complaint Chief Complaint  Patient presents with  . Shortness of Breath    HPI Ann Cline is a 85 y.o. female hx of CHF, pancreatic cancer with pleural effusions, here with SOB. Patient was admitted to the hospital around a week ago and had L thoracentesis for malignant pleural effusion.  Patient has a right Pleurx catheter that was drained yesterday.  Per the daughter, patient has worsening shortness of breath since yesterday.  She was sent home on 2 L nasal cannula since the last admission and she has been using it.  Denies any fevers or chills or cough.  Daughter was concerned that she may need another thoracentesis on the left side.  She initially refused Pleurx on the left side during last admission but now is open to consider that.      The history is provided by the patient.    Past Medical History:  Diagnosis Date  . Cancer ([place]) 07/12/2018   pancreatic cancer  . CHF (congestive heart failure) [facility])     Patient Active Problem List   Diagnosis Date Noted  . Post-tussive emesis 07/19/2018  . Pancreatic mass 07/19/2018  . Acute respiratory failure with hypoxia ([place]) 07/18/2018  . Normochromic normocytic anemia 07/18/2018  . Hyperkalemia 07/07/2018  . Essential hypertension 07/07/2018  . Microcytic anemia 07/07/2018  . Dyspnea 07/04/2018  . Malignant pleural effusion 07/04/2018  . CHF (congestive heart failure) ([place]) 07/01/2018  . HCAP (healthcare-associated pneumonia) 06/16/2018  . Pneumonia 06/15/2018  . Hyperglycemia 06/15/2018  . Trigger finger of right thumb 09/22/2016    Past Surgical History:  Procedure Laterality Date  . ABDOMINAL HYSTERECTOMY  1975  . IR THORACENTESIS ASP PLEURAL SPACE W/IMG GUIDE  06/17/2018  . IR THORACENTESIS ASP PLEURAL SPACE W/IMG GUIDE  07/05/2018     OB History   No obstetric history on  file.      Home Medications    Prior to Admission medications   Medication Sig Start Date End Date Taking? Authorizing Provider  acetaminophen (TYLENOL) 500 MG tablet Take 1,000 mg by mouth every 6 (six) hours as needed for mild pain or headache.   Yes [provider]  alendronate (FOSAMAX) 70 MG tablet Take 70 mg by mouth every Tuesday.  05/15/18  Yes [provider]  carboxymethylcellulose (REFRESH PLUS) 0.5 % SOLN Place 1 drop into both eyes as needed (for dry eyes).    Yes [provider]  carvedilol (COREG) 3.125 MG tablet Take 1 tablet (3.125 mg total) by mouth 2 (two) times daily with a meal. 07/01/18  Yes Revankar, Reita Cliche, MD  docusate sodium (COLACE) 100 MG capsule Take 2 capsules (200 mg total) by mouth 2 (two) times daily as needed for mild constipation. 06/20/18  Yes Thurnell Lose, MD  ferrous sulfate 325 (65 FE) MG tablet Take 1 tablet (325 mg total) by mouth 2 (two) times daily with a meal. Patient taking differently: Take 325 mg by mouth every Monday, Wednesday, and Friday.  06/20/18  Yes Thurnell Lose, MD  furosemide (LASIX) 40 MG tablet Take 1 tablet (40 mg total) by mouth daily. 06/20/18  Yes Thurnell Lose, MD  Multiple Vitamin (MULTIVITAMIN) capsule Take 1 capsule by mouth daily.   Yes [provider]  ondansetron (ZOFRAN-ODT) 8 MG disintegrating tablet Take 1 tablet by mouth every 8 (eight) hours.  07/24/18  Yes [provider]  oxyCODONE (OXY IR/ROXICODONE) 5 MG immediate release tablet Take 5 mg by mouth every 4 (four) hours as needed for severe pain. 07/24/18  Yes [provider]  polyethylene glycol powder (GLYCOLAX/MIRALAX) powder Take 17 g by mouth daily as needed for moderate constipation. 07/15/18  Yes [provider]  traMADol (ULTRAM) 50 MG tablet Take 50 mg by mouth 2 (two) times daily as needed for moderate pain. 07/15/18  Yes [provider]    Family History Family History  Problem  Relation Age of Onset  . Cancer Sister   . Cancer Brother     Social History Social History   Tobacco Use  . Smoking status: Former Research scientist (life sciences)  . Smokeless tobacco: Never Used  Substance Use Topics  . Alcohol use: Never    Frequency: Never  . Drug use: Never     Allergies   Patient has no known allergies.   Review of Systems Review of Systems  Respiratory: Positive for shortness of breath.   All other systems reviewed and are negative.    Physical Exam Updated Vital Signs BP (!) 141/77   Pulse 90   Temp 98.9 F (37.2 C) (Oral)   Resp (!) 22   Ht 5' (1.524 m)   Wt 56.9 kg   SpO2 98%   BMI 24.49 kg/m   Physical Exam Vitals signs and nursing note reviewed.  Constitutional:      Comments: Chronically ill   HENT:     Head: Normocephalic.     Mouth/Throat:     Mouth: Mucous membranes are moist.  Eyes:     Extraocular Movements: Extraocular movements intact.     Pupils: Pupils are equal, round, and reactive to light.  Neck:     Musculoskeletal: Normal range of motion.  Cardiovascular:     Rate and Rhythm: Normal rate and regular rhythm.  Pulmonary:     Comments: Crackles bilateral bases. R pleurex in place  Abdominal:     General: Bowel sounds are normal.     Palpations: Abdomen is soft.  Musculoskeletal:     Comments: 1+ edema bilaterally   Skin:    General: Skin is warm.     Capillary Refill: Capillary refill takes less than 2 seconds.  Neurological:     General: No focal deficit present.     Mental Status: She is oriented to person, place, and time.  Psychiatric:        Mood and Affect: Mood normal.        Behavior: Behavior normal.      ED Treatments / Results  Labs (all labs ordered are listed, but only abnormal results are displayed) Labs Reviewed  COMPREHENSIVE METABOLIC PANEL - Abnormal; Notable for the following components:      Result Value   Sodium 128 (*)    Chloride 97 (*)    Glucose, Bld 176 (*)    Calcium 8.0 (*)    Albumin  2.9 (*)    Alkaline Phosphatase 178 (*)    GFR calc non Af Amer 56 (*)    All other components within normal limits  CBC WITH DIFFERENTIAL/PLATELET - Abnormal; Notable for the following components:   RBC 5.25 (*)    MCV 76.2 (*)    MCH 22.9 (*)    All other components within normal limits  LIPASE, BLOOD  BRAIN NATRIURETIC PEPTIDE  I-STAT TROPONIN, ED    EKG EKG Interpretation  Date/Time:  Sunday July 28 2018 15:12:28 EDT Ventricular Rate:  90 PR Interval:    QRS Duration: 97 QT Interval:  349 QTC Calculation: 427 R Axis:   -9 Text Interpretation:  Sinus rhythm Anterior infarct, old No significant change since last tracing Confirmed by Wandra Arthurs [id number]) on 07/28/2018 3:34:13 PM   Radiology Dg Chest Portable 1 View  Result Date: 07/28/2018 CLINICAL DATA:  Shortness of breath.  Pancreatic carcinoma EXAM: PORTABLE CHEST 1 VIEW COMPARISON:  July 19, 2018 FINDINGS: Chest tube remains on the right, unchanged in position. No pneumothorax. There are pleural effusions bilaterally, slightly smaller on the left and essentially stable on the right compared to most recent study. There is atelectatic change in the lung bases. Heart size and pulmonary vascularity are normal. No adenopathy. No bone lesions. IMPRESSION: Stable chest tube position on the right. No pneumothorax. Pleural effusions bilaterally, slightly smaller on the left and stable on the right. Bibasilar atelectasis. No frank airspace consolidation. Stable cardiac silhouette. Electronically Signed   By: Lowella Grip III M.D.   On: 07/28/2018 15:44    Procedures Procedures (including critical care time)  Medications Ordered in ED Medications  furosemide (LASIX) injection 40 mg (40 mg Intravenous Given 07/28/18 1722)     Initial Impression / Assessment and Plan / ED Course  I have reviewed the triage vital signs and the nursing notes.  Pertinent labs & imaging results that were available during my care of the patient  were reviewed by me and considered in my medical decision making (see chart for details).       Ann Cline is a 85 y.o. female here with SOB. Likely CHF exacerbation vs worsening malignant pleural effusions. Will get labs, BNP, CXR.   6:54 PM  Patient's BNP is normal. Her sodium is 128 but baseline around 133. Appears volume overloaded. Her chest x-ray shows stable effusions.  Since her effusions are not much worse than usual, I do not think she needs emergent thoracentesis.  They want to consider a Pleurx catheter. I talked to Dr. Roxan Hockey from [facility] [place] [facility]. He states that patient can call the office in the morning to schedule the appointment and go over the procedure.  She received 40 of IV Lasix in the ED and was able to urinate.  She states that her breathing improved.  Her oxygen is 99 200% on her baseline 2 L nasal cannula.  Stable for discharge with close follow-up with CT surgery. She has oncology follow up in 3 days.    Final Clinical Impressions(s) / ED Diagnoses   Final diagnoses:  None    ED Discharge Orders    None       Drenda Freeze, MD 07/28/18 [phone]

## 2018-07-28 NOTE — ED Triage Notes (Signed)
Patient arrives POV with daughter. Reports patient having shortness of breathe worsening yesterday. Patient has metastatic pancreatic cancer. Daughter states pt had fluid removed from the left side of her lungs on Tuesday. Patient has a pleural catheter on right side, where daughter drains about every other day.

## 2018-07-28 NOTE — Discharge Instructions (Signed)
Continue lasix 40 mg daily. Add 20 mg daily x 3 days to help you with fluid in your lungs   Call Dr. Roxan Hockey tomorrow for appointment for pleurex.   See your oncologist and primary care doctor.   Return to ER if you have worse shortness of breath, trouble breathing.

## 2018-07-30 DIAGNOSIS — J189 Pneumonia, unspecified organism: Secondary | ICD-10-CM | POA: Diagnosis not present

## 2018-07-30 DIAGNOSIS — I110 Hypertensive heart disease with heart failure: Secondary | ICD-10-CM | POA: Diagnosis not present

## 2018-07-30 DIAGNOSIS — M810 Age-related osteoporosis without current pathological fracture: Secondary | ICD-10-CM | POA: Diagnosis not present

## 2018-07-30 DIAGNOSIS — J918 Pleural effusion in other conditions classified elsewhere: Secondary | ICD-10-CM | POA: Diagnosis not present

## 2018-07-30 DIAGNOSIS — D649 Anemia, unspecified: Secondary | ICD-10-CM | POA: Diagnosis not present

## 2018-07-30 DIAGNOSIS — R1310 Dysphagia, unspecified: Secondary | ICD-10-CM | POA: Diagnosis not present

## 2018-07-30 DIAGNOSIS — I5033 Acute on chronic diastolic (congestive) heart failure: Secondary | ICD-10-CM | POA: Diagnosis not present

## 2018-07-31 DIAGNOSIS — J90 Pleural effusion, not elsewhere classified: Secondary | ICD-10-CM | POA: Diagnosis not present

## 2018-07-31 DIAGNOSIS — C259 Malignant neoplasm of pancreas, unspecified: Secondary | ICD-10-CM | POA: Diagnosis not present

## 2018-07-31 DIAGNOSIS — M1990 Unspecified osteoarthritis, unspecified site: Secondary | ICD-10-CM | POA: Diagnosis not present

## 2018-07-31 DIAGNOSIS — R530 Neoplastic (malignant) related fatigue: Secondary | ICD-10-CM | POA: Diagnosis not present

## 2018-07-31 DIAGNOSIS — R918 Other nonspecific abnormal finding of lung field: Secondary | ICD-10-CM | POA: Diagnosis not present

## 2018-07-31 DIAGNOSIS — I110 Hypertensive heart disease with heart failure: Secondary | ICD-10-CM | POA: Diagnosis not present

## 2018-07-31 DIAGNOSIS — Z978 Presence of other specified devices: Secondary | ICD-10-CM | POA: Diagnosis not present

## 2018-07-31 DIAGNOSIS — Z87891 Personal history of nicotine dependence: Secondary | ICD-10-CM | POA: Diagnosis not present

## 2018-07-31 DIAGNOSIS — I5032 Chronic diastolic (congestive) heart failure: Secondary | ICD-10-CM | POA: Diagnosis not present

## 2018-07-31 DIAGNOSIS — J449 Chronic obstructive pulmonary disease, unspecified: Secondary | ICD-10-CM | POA: Diagnosis not present

## 2018-07-31 DIAGNOSIS — Z79899 Other long term (current) drug therapy: Secondary | ICD-10-CM | POA: Diagnosis not present

## 2018-07-31 DIAGNOSIS — R54 Age-related physical debility: Secondary | ICD-10-CM | POA: Diagnosis not present

## 2018-07-31 DIAGNOSIS — R0602 Shortness of breath: Secondary | ICD-10-CM | POA: Diagnosis not present

## 2018-07-31 DIAGNOSIS — R110 Nausea: Secondary | ICD-10-CM | POA: Diagnosis not present

## 2018-07-31 DIAGNOSIS — J910 Malignant pleural effusion: Secondary | ICD-10-CM | POA: Diagnosis not present

## 2018-07-31 DIAGNOSIS — I509 Heart failure, unspecified: Secondary | ICD-10-CM | POA: Diagnosis not present

## 2018-07-31 DIAGNOSIS — M810 Age-related osteoporosis without current pathological fracture: Secondary | ICD-10-CM | POA: Diagnosis not present

## 2018-08-07 DIAGNOSIS — R1310 Dysphagia, unspecified: Secondary | ICD-10-CM | POA: Diagnosis not present

## 2018-08-07 DIAGNOSIS — D649 Anemia, unspecified: Secondary | ICD-10-CM | POA: Diagnosis not present

## 2018-08-07 DIAGNOSIS — I110 Hypertensive heart disease with heart failure: Secondary | ICD-10-CM | POA: Diagnosis not present

## 2018-08-07 DIAGNOSIS — R0602 Shortness of breath: Secondary | ICD-10-CM | POA: Diagnosis not present

## 2018-08-07 DIAGNOSIS — C252 Malignant neoplasm of tail of pancreas: Secondary | ICD-10-CM | POA: Diagnosis not present

## 2018-08-07 DIAGNOSIS — J910 Malignant pleural effusion: Secondary | ICD-10-CM | POA: Diagnosis not present

## 2018-08-07 DIAGNOSIS — J9691 Respiratory failure, unspecified with hypoxia: Secondary | ICD-10-CM | POA: Diagnosis not present

## 2018-08-07 DIAGNOSIS — I5033 Acute on chronic diastolic (congestive) heart failure: Secondary | ICD-10-CM | POA: Diagnosis not present

## 2018-08-07 DIAGNOSIS — Z79899 Other long term (current) drug therapy: Secondary | ICD-10-CM | POA: Diagnosis not present

## 2018-08-07 DIAGNOSIS — M810 Age-related osteoporosis without current pathological fracture: Secondary | ICD-10-CM | POA: Diagnosis not present

## 2018-08-07 DIAGNOSIS — J918 Pleural effusion in other conditions classified elsewhere: Secondary | ICD-10-CM | POA: Diagnosis not present

## 2018-08-07 DIAGNOSIS — J189 Pneumonia, unspecified organism: Secondary | ICD-10-CM | POA: Diagnosis not present

## 2018-08-09 DIAGNOSIS — R079 Chest pain, unspecified: Secondary | ICD-10-CM | POA: Diagnosis not present

## 2018-08-09 DIAGNOSIS — Z8679 Personal history of other diseases of the circulatory system: Secondary | ICD-10-CM | POA: Diagnosis not present

## 2018-08-09 DIAGNOSIS — G893 Neoplasm related pain (acute) (chronic): Secondary | ICD-10-CM | POA: Diagnosis not present

## 2018-08-09 DIAGNOSIS — J984 Other disorders of lung: Secondary | ICD-10-CM | POA: Diagnosis not present

## 2018-08-09 DIAGNOSIS — J948 Other specified pleural conditions: Secondary | ICD-10-CM | POA: Diagnosis not present

## 2018-08-09 DIAGNOSIS — Z978 Presence of other specified devices: Secondary | ICD-10-CM | POA: Diagnosis not present

## 2018-08-09 DIAGNOSIS — J9602 Acute respiratory failure with hypercapnia: Secondary | ICD-10-CM | POA: Diagnosis not present

## 2018-08-09 DIAGNOSIS — Z86711 Personal history of pulmonary embolism: Secondary | ICD-10-CM | POA: Diagnosis not present

## 2018-08-09 DIAGNOSIS — R0682 Tachypnea, not elsewhere classified: Secondary | ICD-10-CM | POA: Diagnosis not present

## 2018-08-09 DIAGNOSIS — R6510 Systemic inflammatory response syndrome (SIRS) of non-infectious origin without acute organ dysfunction: Secondary | ICD-10-CM | POA: Diagnosis not present

## 2018-08-09 DIAGNOSIS — J918 Pleural effusion in other conditions classified elsewhere: Secondary | ICD-10-CM | POA: Diagnosis not present

## 2018-08-09 DIAGNOSIS — J9601 Acute respiratory failure with hypoxia: Secondary | ICD-10-CM | POA: Diagnosis not present

## 2018-08-09 DIAGNOSIS — I110 Hypertensive heart disease with heart failure: Secondary | ICD-10-CM | POA: Diagnosis not present

## 2018-08-09 DIAGNOSIS — C259 Malignant neoplasm of pancreas, unspecified: Secondary | ICD-10-CM | POA: Diagnosis not present

## 2018-08-09 DIAGNOSIS — G9389 Other specified disorders of brain: Secondary | ICD-10-CM | POA: Diagnosis not present

## 2018-08-09 DIAGNOSIS — R0902 Hypoxemia: Secondary | ICD-10-CM | POA: Diagnosis not present

## 2018-08-09 DIAGNOSIS — J189 Pneumonia, unspecified organism: Secondary | ICD-10-CM | POA: Diagnosis not present

## 2018-08-09 DIAGNOSIS — J9620 Acute and chronic respiratory failure, unspecified whether with hypoxia or hypercapnia: Secondary | ICD-10-CM | POA: Diagnosis not present

## 2018-08-09 DIAGNOSIS — I2699 Other pulmonary embolism without acute cor pulmonale: Secondary | ICD-10-CM | POA: Diagnosis not present

## 2018-08-09 DIAGNOSIS — R1312 Dysphagia, oropharyngeal phase: Secondary | ICD-10-CM | POA: Diagnosis not present

## 2018-08-09 DIAGNOSIS — Z9981 Dependence on supplemental oxygen: Secondary | ICD-10-CM | POA: Diagnosis not present

## 2018-08-09 DIAGNOSIS — I5030 Unspecified diastolic (congestive) heart failure: Secondary | ICD-10-CM | POA: Diagnosis not present

## 2018-08-09 DIAGNOSIS — J942 Hemothorax: Secondary | ICD-10-CM | POA: Diagnosis not present

## 2018-08-09 DIAGNOSIS — M1380 Other specified arthritis, unspecified site: Secondary | ICD-10-CM | POA: Diagnosis not present

## 2018-08-09 DIAGNOSIS — E639 Nutritional deficiency, unspecified: Secondary | ICD-10-CM | POA: Diagnosis not present

## 2018-08-09 DIAGNOSIS — J449 Chronic obstructive pulmonary disease, unspecified: Secondary | ICD-10-CM | POA: Diagnosis not present

## 2018-08-09 DIAGNOSIS — I2694 Multiple subsegmental pulmonary emboli without acute cor pulmonale: Secondary | ICD-10-CM | POA: Diagnosis not present

## 2018-08-09 DIAGNOSIS — D259 Leiomyoma of uterus, unspecified: Secondary | ICD-10-CM | POA: Diagnosis not present

## 2018-08-09 DIAGNOSIS — J90 Pleural effusion, not elsewhere classified: Secondary | ICD-10-CM | POA: Diagnosis not present

## 2018-08-09 DIAGNOSIS — I5022 Chronic systolic (congestive) heart failure: Secondary | ICD-10-CM | POA: Diagnosis not present

## 2018-08-09 DIAGNOSIS — I82451 Acute embolism and thrombosis of right peroneal vein: Secondary | ICD-10-CM | POA: Diagnosis not present

## 2018-08-09 DIAGNOSIS — Z9911 Dependence on respirator [ventilator] status: Secondary | ICD-10-CM | POA: Diagnosis not present

## 2018-08-09 DIAGNOSIS — R0602 Shortness of breath: Secondary | ICD-10-CM | POA: Diagnosis not present

## 2018-08-09 DIAGNOSIS — M810 Age-related osteoporosis without current pathological fracture: Secondary | ICD-10-CM | POA: Diagnosis not present

## 2018-08-09 DIAGNOSIS — I959 Hypotension, unspecified: Secondary | ICD-10-CM | POA: Diagnosis not present

## 2018-08-09 DIAGNOSIS — J9690 Respiratory failure, unspecified, unspecified whether with hypoxia or hypercapnia: Secondary | ICD-10-CM | POA: Diagnosis not present

## 2018-08-09 DIAGNOSIS — J810 Acute pulmonary edema: Secondary | ICD-10-CM | POA: Diagnosis not present

## 2018-08-09 DIAGNOSIS — R310 Gross hematuria: Secondary | ICD-10-CM | POA: Diagnosis not present

## 2018-08-09 DIAGNOSIS — R9431 Abnormal electrocardiogram [ECG] [EKG]: Secondary | ICD-10-CM | POA: Diagnosis not present

## 2018-08-09 DIAGNOSIS — G9340 Encephalopathy, unspecified: Secondary | ICD-10-CM | POA: Diagnosis not present

## 2018-08-09 DIAGNOSIS — I214 Non-ST elevation (NSTEMI) myocardial infarction: Secondary | ICD-10-CM | POA: Diagnosis not present

## 2018-08-09 DIAGNOSIS — R4182 Altered mental status, unspecified: Secondary | ICD-10-CM | POA: Diagnosis not present

## 2018-08-09 DIAGNOSIS — R918 Other nonspecific abnormal finding of lung field: Secondary | ICD-10-CM | POA: Diagnosis not present

## 2018-08-09 DIAGNOSIS — Z87891 Personal history of nicotine dependence: Secondary | ICD-10-CM | POA: Diagnosis not present

## 2018-08-09 DIAGNOSIS — J690 Pneumonitis due to inhalation of food and vomit: Secondary | ICD-10-CM | POA: Diagnosis not present

## 2018-08-09 DIAGNOSIS — C799 Secondary malignant neoplasm of unspecified site: Secondary | ICD-10-CM | POA: Diagnosis not present

## 2018-08-09 DIAGNOSIS — I509 Heart failure, unspecified: Secondary | ICD-10-CM | POA: Diagnosis not present

## 2018-08-09 DIAGNOSIS — R633 Feeding difficulties: Secondary | ICD-10-CM | POA: Diagnosis not present

## 2018-08-09 DIAGNOSIS — R7989 Other specified abnormal findings of blood chemistry: Secondary | ICD-10-CM | POA: Diagnosis not present

## 2018-08-09 DIAGNOSIS — J910 Malignant pleural effusion: Secondary | ICD-10-CM | POA: Diagnosis not present

## 2018-08-09 DIAGNOSIS — D509 Iron deficiency anemia, unspecified: Secondary | ICD-10-CM | POA: Diagnosis not present

## 2018-08-09 DIAGNOSIS — R69 Illness, unspecified: Secondary | ICD-10-CM | POA: Diagnosis not present

## 2018-08-09 DIAGNOSIS — E878 Other disorders of electrolyte and fluid balance, not elsewhere classified: Secondary | ICD-10-CM | POA: Diagnosis not present

## 2018-08-09 DIAGNOSIS — R0609 Other forms of dyspnea: Secondary | ICD-10-CM | POA: Diagnosis not present

## 2018-08-09 DIAGNOSIS — I5189 Other ill-defined heart diseases: Secondary | ICD-10-CM | POA: Diagnosis not present

## 2018-08-09 DIAGNOSIS — I5032 Chronic diastolic (congestive) heart failure: Secondary | ICD-10-CM | POA: Diagnosis not present

## 2018-08-09 DIAGNOSIS — E440 Moderate protein-calorie malnutrition: Secondary | ICD-10-CM | POA: Diagnosis not present

## 2018-08-09 DIAGNOSIS — J9811 Atelectasis: Secondary | ICD-10-CM | POA: Diagnosis not present

## 2018-09-13 DEATH — deceased

## 2019-01-01 ENCOUNTER — Ambulatory Visit: Payer: Medicare HMO | Admitting: Cardiology

## 2020-07-22 IMAGING — CT CT ANGIO CHEST
2 of 6 series · 19 of 36 positions shown · IV contrast (iopamidol)
Comparison: Chest radiograph, 06/15/2018

CLINICAL DATA: Shortness of breath.

EXAM:
CT ANGIOGRAPHY CHEST WITH CONTRAST
TECHNIQUE: Multidetector CT imaging of the chest was performed using the
standard protocol during bolus administration of intravenous
contrast. Multiplanar CT image reconstructions and MIPs were
obtained to evaluate the vascular anatomy.
CONTRAST:  100mL JRE64S-DKM IOPAMIDOL (JRE64S-DKM) INJECTION 76%

[Series 7: pe thins · axial · 0.57mm/px · z∈[+1210,+1450]mm · 18 of 383 slices shown]
[im 20/383  lung]
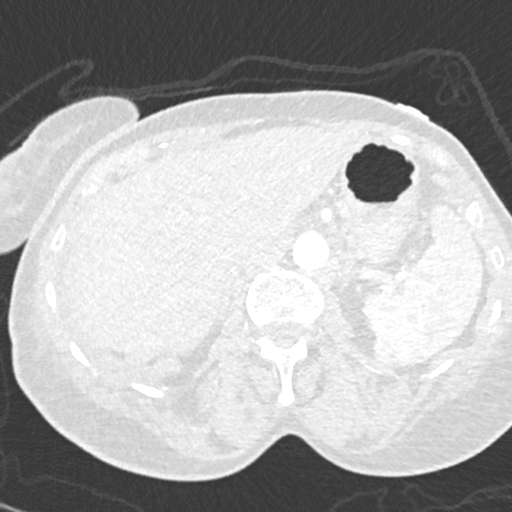
[im 39/383  mediastinal]
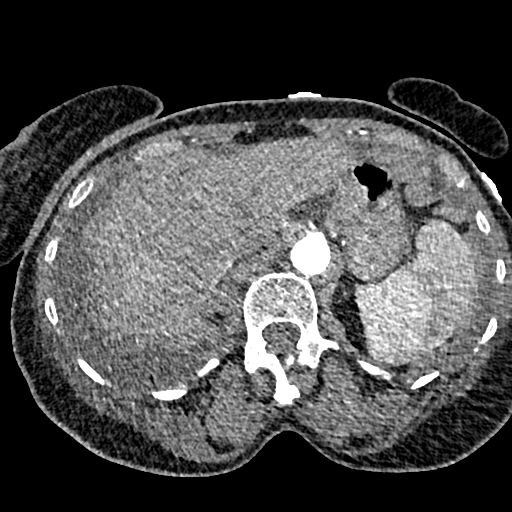
[im 58/383  lung]
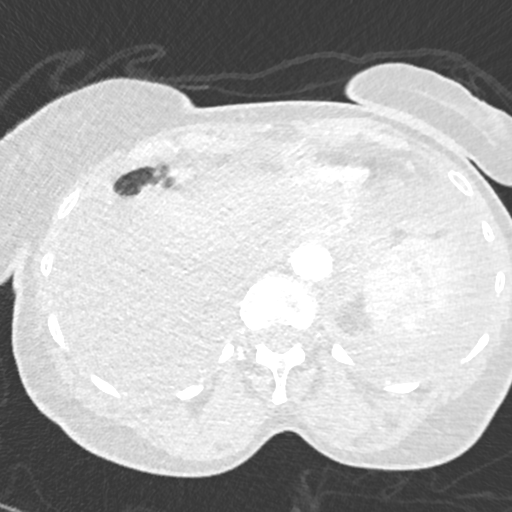
[im 77/383  mediastinal]
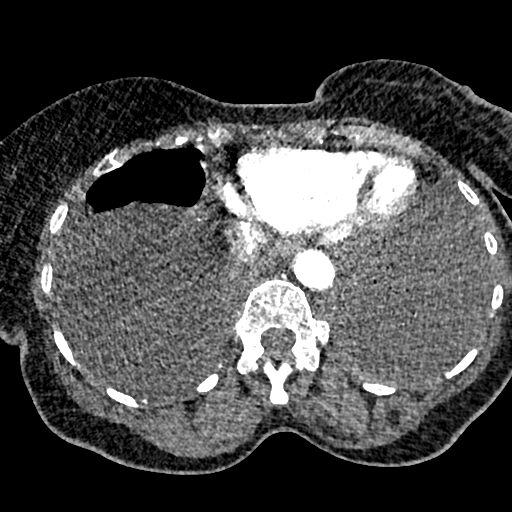
[im 96/383  lung]
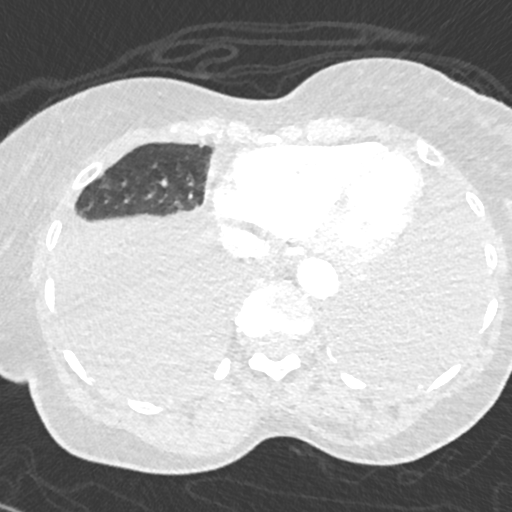
[im 115/383  mediastinal]
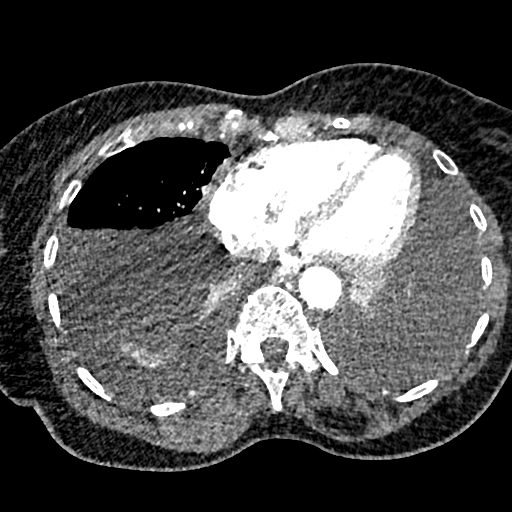
[im 134/383  lung]
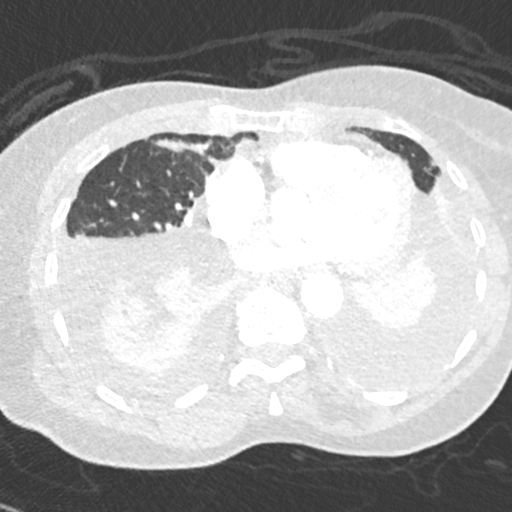
[im 153/383  mediastinal]
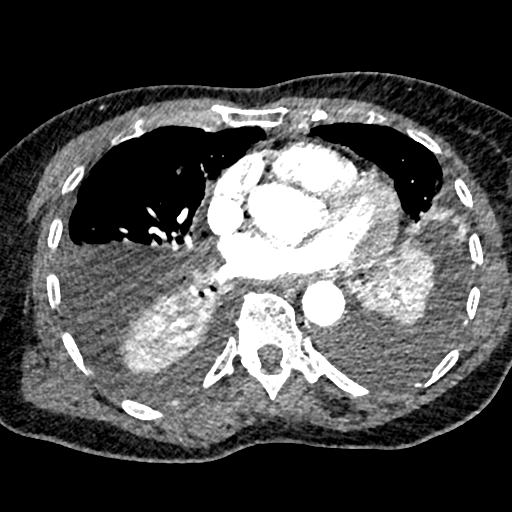
[im 172/383  lung]
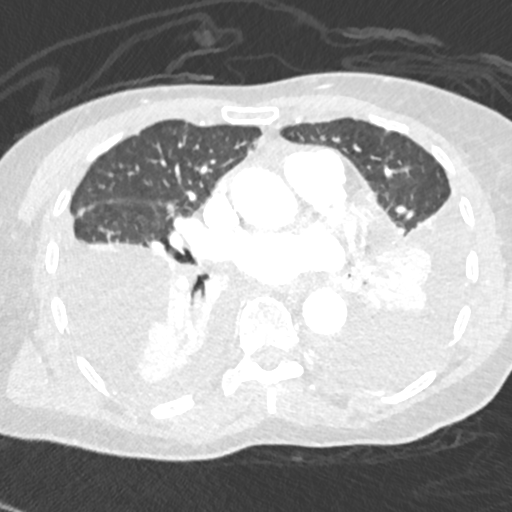
[im 211/383  mediastinal]
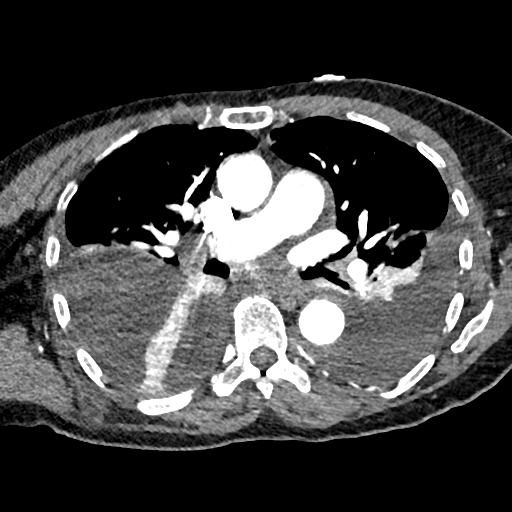
[im 230/383  lung]
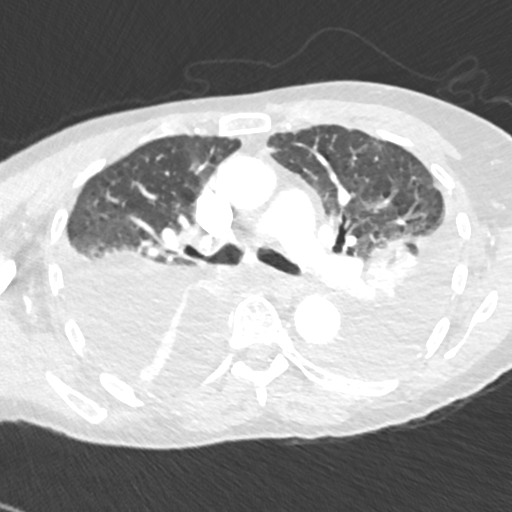
[im 249/383  mediastinal]
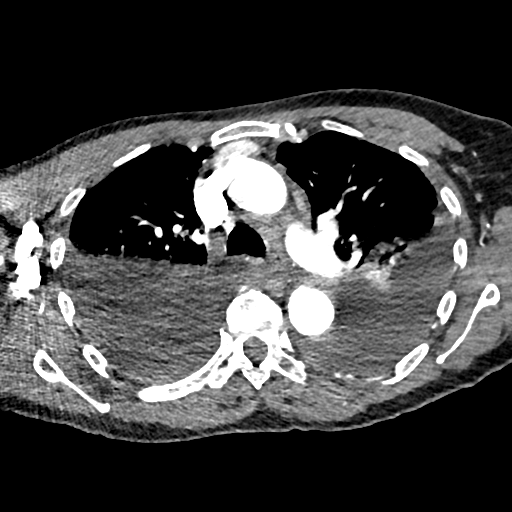
[im 268/383  lung]
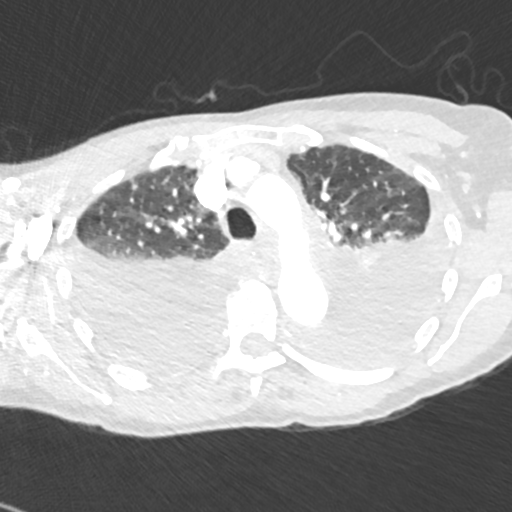
[im 287/383  mediastinal]
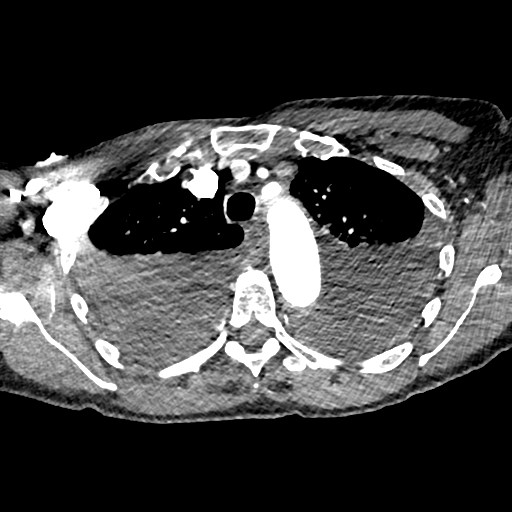
[im 306/383  lung]
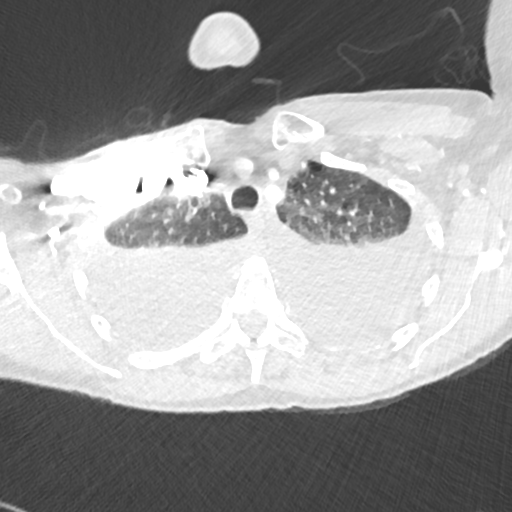
[im 325/383  mediastinal]
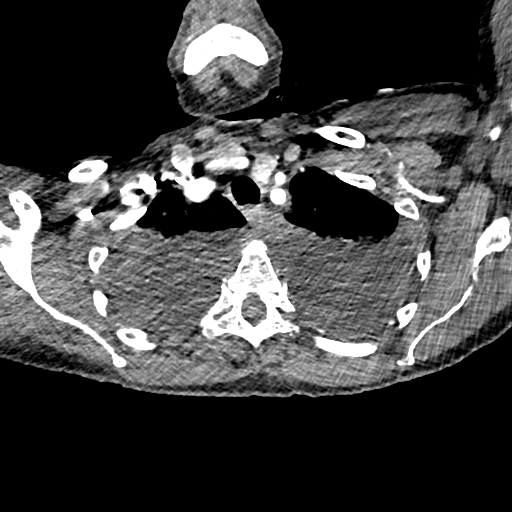
[im 344/383  lung]
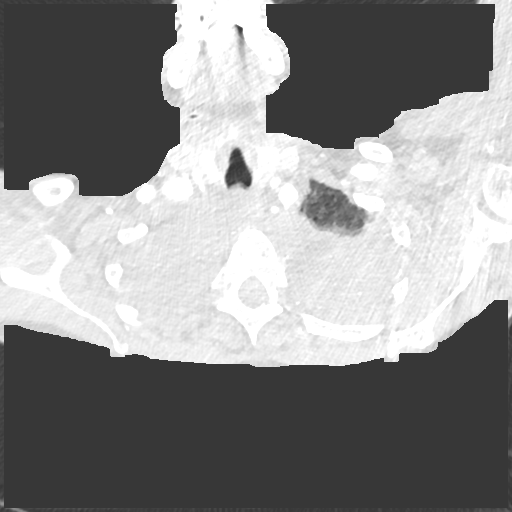
[im 363/383  mediastinal]
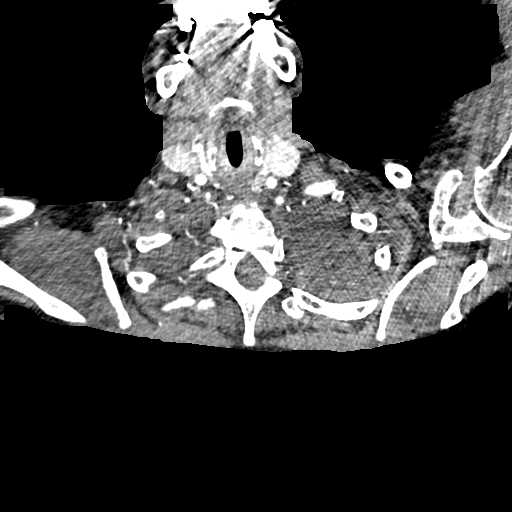

[Series 8: pe 2mm cor · coronal · 0.59mm/px · 1 of 151 slices shown]
[im 76/151  mediastinal]
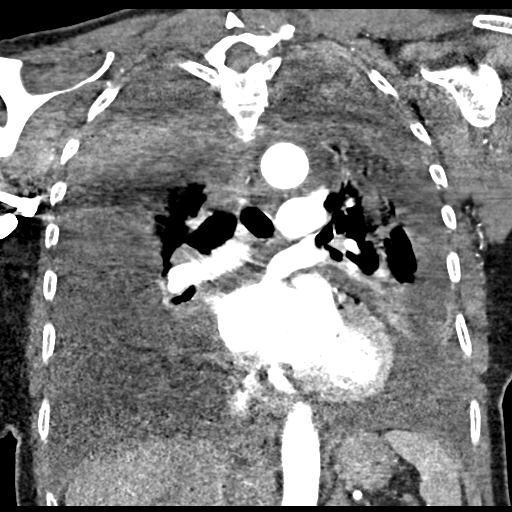

[19 of 36 positions shown; findings below may reference images not displayed]

FINDINGS: Cardiovascular: There is satisfactory opacification of the pulmonary
arteries to the segmental level. There is no evidence of a pulmonary
embolism.

Heart is normal in size and configuration. No pericardial effusion.
No coronary artery calcifications. Great vessels are normal in
caliber. No aortic dissection or atherosclerosis.

Mediastinum/Nodes: No mediastinal or hilar masses. No enlarged lymph
nodes. Trachea and esophagus are unremarkable. There is increased
attenuation in the mediastinal and hilar fat consistent with edema.

Lungs/Pleura: Large bilateral pleural effusions. Complete
atelectasis of both lower lobes. Mild dependent atelectasis noted in
the upper lobes and right middle lobe. Minor areas of ground-glass
opacity in the superior aspect of the upper lobes, also likely
atelectasis. No convincing pneumonia or pulmonary edema. No lung
mass or suspicious nodule. No pneumothorax.

Upper Abdomen: No acute abnormality.

Musculoskeletal: No fracture or acute finding. No osteoblastic or
osteolytic lesions.

Review of the MIP images confirms the above findings.
IMPRESSION: 1. No evidence of a pulmonary embolism.
2. Large bilateral pleural effusions with complete lower lobe
atelectasis bilaterally. Mild dependent atelectasis in the upper
lobes and right middle lobe.
3. No convincing pneumonia or pulmonary edema.
